# Patient Record
Sex: Male | Born: 1960 | Race: White | Hispanic: No | Marital: Single | Smoking: Current some day smoker
Health system: Southern US, Community
[De-identification: ages and names within clinical notes are randomized; demographics above are authoritative.]

## PROBLEM LIST (undated history)

## (undated) DIAGNOSIS — N529 Male erectile dysfunction, unspecified: Secondary | ICD-10-CM

## (undated) DIAGNOSIS — M51369 Other intervertebral disc degeneration, lumbar region without mention of lumbar back pain or lower extremity pain: Secondary | ICD-10-CM

## (undated) DIAGNOSIS — J3081 Allergic rhinitis due to animal (cat) (dog) hair and dander: Secondary | ICD-10-CM

## (undated) DIAGNOSIS — Z9109 Other allergy status, other than to drugs and biological substances: Secondary | ICD-10-CM

## (undated) DIAGNOSIS — E538 Deficiency of other specified B group vitamins: Secondary | ICD-10-CM

## (undated) DIAGNOSIS — Z96649 Presence of unspecified artificial hip joint: Secondary | ICD-10-CM

## (undated) DIAGNOSIS — I10 Essential (primary) hypertension: Secondary | ICD-10-CM

## (undated) DIAGNOSIS — T7840XA Allergy, unspecified, initial encounter: Secondary | ICD-10-CM

## (undated) DIAGNOSIS — M5136 Other intervertebral disc degeneration, lumbar region: Secondary | ICD-10-CM

## (undated) DIAGNOSIS — M1990 Unspecified osteoarthritis, unspecified site: Secondary | ICD-10-CM

## (undated) DIAGNOSIS — K635 Polyp of colon: Secondary | ICD-10-CM

## (undated) HISTORY — PX: REPLACEMENT TOTAL HIP W/  RESURFACING IMPLANTS: SUR1222

## (undated) HISTORY — DX: Other intervertebral disc degeneration, lumbar region without mention of lumbar back pain or lower extremity pain: M51.369

## (undated) HISTORY — DX: Essential (primary) hypertension: I10

## (undated) HISTORY — PX: JOINT REPLACEMENT: SHX530

## (undated) HISTORY — DX: Allergic rhinitis due to animal (cat) (dog) hair and dander: J30.81

## (undated) HISTORY — DX: Polyp of colon: K63.5

## (undated) HISTORY — DX: Other allergy status, other than to drugs and biological substances: Z91.09

## (undated) HISTORY — DX: Other intervertebral disc degeneration, lumbar region: M51.36

## (undated) HISTORY — DX: Deficiency of other specified B group vitamins: E53.8

## (undated) HISTORY — DX: Male erectile dysfunction, unspecified: N52.9

## (undated) HISTORY — DX: Unspecified osteoarthritis, unspecified site: M19.90

## (undated) HISTORY — DX: Allergy, unspecified, initial encounter: T78.40XA

## (undated) HISTORY — PX: SKIN LESION EXCISION: SHX2412

## (undated) HISTORY — DX: Presence of unspecified artificial hip joint: Z96.649

---

## 1998-07-06 DIAGNOSIS — I10 Essential (primary) hypertension: Secondary | ICD-10-CM

## 1998-07-06 HISTORY — DX: Essential (primary) hypertension: I10

## 2000-08-02 ENCOUNTER — Encounter: Payer: Self-pay | Admitting: Orthopedic Surgery

## 2000-08-09 ENCOUNTER — Inpatient Hospital Stay (HOSPITAL_COMMUNITY): Admission: RE | Admit: 2000-08-09 | Discharge: 2000-08-13 | Payer: Self-pay | Admitting: Orthopedic Surgery

## 2000-08-09 ENCOUNTER — Encounter: Payer: Self-pay | Admitting: Orthopedic Surgery

## 2005-10-28 ENCOUNTER — Encounter: Admission: RE | Admit: 2005-10-28 | Discharge: 2005-10-28 | Payer: Self-pay | Admitting: Otolaryngology

## 2006-12-02 ENCOUNTER — Encounter: Admission: RE | Admit: 2006-12-02 | Discharge: 2006-12-02 | Payer: Self-pay | Admitting: Orthopedic Surgery

## 2007-09-07 ENCOUNTER — Inpatient Hospital Stay (HOSPITAL_COMMUNITY): Admission: RE | Admit: 2007-09-07 | Discharge: 2007-09-10 | Payer: Self-pay | Admitting: Orthopedic Surgery

## 2010-11-18 NOTE — Op Note (Signed)
NAME:  Walter Black, Walter Black                 ACCOUNT NO.:  [id number]   MEDICAL RECORD NO.:  [id number]          PATIENT TYPE:  INP   LOCATION:  0002                         FACILITY:  [facility]   PHYSICIAN:  Ollen Gross, M.D.    DATE OF BIRTH:  [birth year]   DATE OF PROCEDURE:  DATE OF DISCHARGE:                               OPERATIVE REPORT   PREOPERATIVE DIAGNOSIS:  Osteoarthritis, right hip.   POSTOPERATIVE DIAGNOSIS:  Osteoarthritis, right hip.   PROCEDURE:  Right total hip arthroplasty.   SURGEON:  Ollen Gross, M.D.   ASSISTANT:  Avel Peace, PA-C.   ANESTHESIA:  General.   ESTIMATED BLOOD LOSS:  700 ML.   DRAINS:  Hemovac x1.   COMPLICATIONS:  None.   CONDITION:  Stable.   BRIEF CLINICAL NOTE:  Walter Black is a 50 year old male with end-stage  arthritis of the right hip with progressively worsening pain and  dysfunction.  He has had a successful left total hip arthroplasty and  presents now for right total hip arthroplasty.   PROCEDURE IN DETAIL:  After successful administration of general  anesthetic, the patient is placed in the left lateral decubitus position  with the right side up and held with the hip positioner.  The right  lower extremity is isolated from his perineum with plastic drapes and  prepped and draped in the usual sterile fashion.  A short posterolateral  incision is made with a 10 blade through subcutaneous tissue to the  level of the fascia lata, which was incised in line with the skin  incision.  The sciatic nerve is palpated and protected and the short  external rotators isolated off the femur.  Capsulectomy is performed and  the hip is dislocated.  The center of the femoral head is marked and a  trial prosthesis placed such that the center of the trial head  corresponds to the center of his native femoral head.  Osteotomy lines  marked on the femoral neck and osteotomy made with an oscillating saw.  The femoral head is removed and then the femur  retracted anteriorly to  gain acetabular exposure.   Acetabular retractors were placed and labrum and osteophytes removed.  Reaming starts at 45 mm, coursing increments of 2 up to 53 mm, then a 54  mm Pinnacle acetabular shell is placed in anatomic position and  transfixed with two dome screws.  The apex hole eliminator was placed  and a permanent 36-mm metal liner is placed.  This is a metal-on-metal  hip replacement.   The femur is prepared with a canal finder and irrigation.  Axial reaming  is performed to 17.5 mm proximal, reaming to 22 D and the sleeve  machined to a small.  A 22 D small trial sleeve is placed with a 22 x 17  stem and a 36 + 8 neck.  A 32+ 0 trial head is placed.  It reduced  fairly easily.  I switched the sleeve out to a D large because the small  was sunken down into the canal a millimeter or 2 and I was concerned  about  sizing.  With the large we were about a millimeter above the bone  cut, but it had a more stable fit to it.  With the trial 22 x 17 stem,  36+ 8 neck and the 32 + 0 about 10 degrees beyond native anteversion, we  reduced and had great stability.  There was full extension, full  external rotation, 70 degrees flexion, 40 degrees adduction, 90 degrees  internal rotation and 90 degrees of flexion, and 70 degrees of internal  rotation.  By placing the right leg on top of the left, leg lengths are  found to be equal.  The hip is then dislocated and all trials are  removed.  The permanent 22 D large sleeve is placed with 22 x 17 stem  and a 36 + 8 neck 10 degrees beyond native anteversion.  A 36 + 0 zero  head is placed and the hip is reduced, with the same stability  parameters.  Wound is copiously irrigated with saline solution and the  short rotators reattached to the femur through drill holes.  The fascia  lata was closed over a Hemovac drain with interrupted #1 Vicryl, the  subcu closed with 1-0 and 2-0 Vicryl and a subcuticular running 4-0   Monocryl.  The drain is hooked to suction.  Incision cleaned and dried,  and Steri-Strips and a bulky sterile dressing applied.  I then placed  into a knee immobilizer, awakened and transported to recovery in stable  condition.      Ollen Gross, M.D.  Electronically Signed     FA/MEDQ  D:  09/07/2007  T:  09/07/2007  Job:  [id number]

## 2010-11-21 NOTE — H&P (Signed)
NAMEJUAQUIN, Walter Black                 ACCOUNT NO.:  [id number]   MEDICAL RECORD NO.:  [id number]          PATIENT TYPE:  INP   LOCATION:  1530                         FACILITY:  [facility]   PHYSICIAN:  Ollen Gross, M.D.    DATE OF BIRTH:  Dec 07, 1960   DATE OF ADMISSION:  09/07/2007  DATE OF DISCHARGE:                              HISTORY & PHYSICAL   CHIEF COMPLAINT:  Right hip pain.   HISTORY OF PRESENT ILLNESS:  The patient is a 50 year old male who has  seen by Dr. Lequita Halt for ongoing hip pain.  He has known arthritis.  Unfortunately, his hip has steadily progressively gotten worse.  He is  having worsening pain and dysfunction.  He is seen in the office where  he is found to have unconfirmed end-stage arthritis in the right hip.  It has actually worsened on radiograph since May of this past year.  He  has reached a point where he would benefit undergoing surgical  intervention.  Risks and benefits have been discussed and he elects to  proceed with surgery.   ALLERGIES:  NO KNOWN DRUG ALLERGIES.   CURRENT MEDICATIONS:  Celebrex, Ultram, Claritin.  He is on blood  pressure medication, Flexeril, Nasonex.   PAST MEDICAL HISTORY:  1. Hypertension.  2. Seasonal allergies.   PAST SURGICAL HISTORY:  Left total hip replacement.   FAMILY HISTORY:  Arthritis.   SOCIAL HISTORY:  Single, is a Psychologist, sport and exercise, two cigars a week, two  drinks of alcohol.   REVIEW OF SYSTEMS:  GENERAL:  No fevers, chills, night sweats.  NEUROLOGICAL:  No seizures, syncope or paralysis.  RESPIRATORY: No  shortness breath, productive cough or hemoptysis.  CARDIOVASCULAR: No  chest pain, angina, orthopnea.  GI: No nausea, diarrhea, constipation.  GU: No dysuria or discharge.  MUSCULOSKELETAL:  Right hip.   PHYSICAL EXAMINATION:  VITAL SIGNS: Pulse 88, respirations 14, blood  pressure 126/84.  GENERAL:  A 50 year old white male, well-nourished, well-developed, no  acute distress, slightly overweight, alert  and cooperative.  Good  historian.  HEENT: Normocephalic, atraumatic.  Pupils are reactive.  EOMs intact.  No wear glasses.  NECK:  Supple.  No bruits.  CHEST:  Clear anterior posterior chest walls.  No rhonchi, rales or  wheezing.  HEART:  Regular rate and rhythm.  No murmur, S1-S10.  ABDOMEN: Soft, round.  Bowel sounds present.  BREAST/RECTAL/GENITALIA:  Not done, not pertinent to present illness.  EXTREMITIES:  Right hip flexion 90-0 internal rotation, 10-15 degrees  external rotation, 10-15 degrees abduction.  Left hip flexion 110,  internal rotation 30, external rotation 40, abduction of 40.   IMPRESSION:  Osteoarthritis right hip.   PLAN:  The patient admitted to [facility] to undergo a right  total hip replacement arthroplasty.  Surgery will be performed by Dr.  Ollen Gross.      Walter Black, P.A.C.      Ollen Gross, M.D.  Electronically Signed    ALP/MEDQ  D:  09/08/2007  T:  09/09/2007  Job:  [id number]   cc:   Ollen Gross, M.D.  Fax: [phone]   Lovenia Kim, D.O.  Fax: [phone]

## 2010-11-21 NOTE — Discharge Summary (Signed)
[name], [name]                 ACCOUNT NO.:  [id number]   MEDICAL RECORD NO.:  [id number]          PATIENT TYPE:  INP   LOCATION:  1530                         FACILITY:  [facility]   PHYSICIAN:  Walter Black, M.D.    DATE OF BIRTH:  Aug 08, 1960   DATE OF ADMISSION:  09/07/2007  DATE OF DISCHARGE:  09/10/2007                               DISCHARGE SUMMARY   ADMITTING DIAGNOSES:  1. Osteoarthritis right hip.  2. Hypertension.  3. Seasonal allergies.   DISCHARGE DIAGNOSES:  1. Osteoarthritis right hip, status post right total hip arthroplasty.  2. Postoperative blood loss anemia, did not require transfusion.  3. Hypertension.  4. Seasonal allergies.   PROCEDURE:  September 07, 2007, right total hip.   SURGEON:  Walter Black, M.D.   ASSISTANT:  Walter Girt PA-C.   ANESTHESIA:  General.   CONSULTS:  None.   BRIEF HISTORY:  Walter Black is a 50 year old male with end-stage arthritis,  progressive worsening pain and dysfunction, successful left total hip  who now presents for right total hip.   LABORATORY DATA:  Pre-op CBC:  Hemoglobin 15.6, hematocrit 44.6, white  cell count 7.6, normal platelets 276, post-op hemoglobin 12.1, drift  down to 10.5.  Last noted H&H 10.4 and 29.4.  PT/INR 12.7 and 0.9 with a  PTT of 30.  Serial pro-times followed.  Last noted PT/INR 18.8 and 1.5.  Chem panel on admission all within normal limits.  Serial BMETs were  followed.  Electrolytes remained within normal limits.  Glucose did go  up from 109 to 153, back down to 121.  Pre-op UA negative, blood group  type O+.  Pre-op chest x-ray September 01, 2007:  Note acute  cardiomegaly, no cardiopulmonary disease.  Chest X-Ray:  September 01, 2007:  No active cardiopulmonary disease.  Right hip film September 01, 2007:  Severe osteoarthritis, right hip with slight degree of __________  of the head, status post left total hip.  Portable hip and pelvis September 07, 2007:  Right total hip without immediate complicating  features.   EKG:  April 14, 2007:  Within normal limits.   HOSPITAL COURSE:  The patient admitted to [facility],  tolerated the procedure well, later transferred to recovery room,  orthopedic floor, started on PCA and p.o. analgesic pain control  following surgery and doing pretty well on the morning of day.  Had  dangled his legs  the evening of surgery.  Started getting up with  therapy on the morning of day one.  Blood pressure looked good,  hemoglobin stable, excellent output, walking about 400 feet on day two,  continued to progress well.  Hemoglobin stable at 10.5.  Dressing change  incision looked good.  Felt to be doing well.  Was ready to go home by  the following day of September 10, 2007.   DISCHARGE/PLAN:  1. Patient discharged home on September 10, 2007.  2. Discharge diagnoses:  Please see above.  3. Discharge meds:  Lovenox, Percocet, Robaxin, Coumadin.   DIET:  Low-sodium, heart-healthy diet.   ACTIVITY:  Partial weightbearing 25%  to 50%, total hip protocol, home  health PT, home health nursing.   FOLLOWUP:  Two weeks.   DISPOSITION:  Home.   CONDITION ON DISCHARGE:  Improving.      Walter Black, P.A.C.      Walter Black, M.D.  Electronically Signed    ALP/MEDQ  D:  10/10/2007  T:  10/10/2007  Job:  [id number]   cc:   Walter Black, D.O.  Fax: [phone]

## 2010-11-21 NOTE — H&P (Signed)
[facility]  Patient:    Walter Black, Walter Black                       MRN: [id number] Adm. Date:  08/09/00 Attending:  Ollen Gross, M.D. Dictator:   Druscilla Brownie. Shela Nevin, P.A. CC:         Ammie Dalton, M.D.   History and Physical  DATE OF BIRTH:  [birth year].  CHIEF COMPLAINT:  "Pain in my left hip."  PRESENT ILLNESS:  This is a 50 year old who has been seen by [place] for continuing problems concerning his left hip.  Over the last year or so, he has had problems with the hip and decreasing levels of activity due to pain in the hip.  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  His pain primarily is in the lateral groin and with some range of motion.  The patient has developed limitation of range of motion and has a 5 degree external rotation contracture without any evidence of internal rotation; he also has developed a flexion contracture.  He walks with an antalgic gait with an abductor lurch.  X-rays have shown a deformity of the femoral head with bone-on-bone changes and severe arthritis.  Due to the fact that this is in fact a very young individual who has had decreasing levels of activity and persistent and constant pain, it is felt he would benefit from surgical intervention and is being admitted for total hip replacement arthroplasty to the left hip utilizing an S-ROM hip with ______ cup and metal-on-metal bearing surfaces.  The patient has donated no blood for this procedure.  PAST MEDICAL HISTORY:  This gentleman has really been in good health throughout his lifetime.  He has some hypertension and is being treated by Dr. Ammie Dalton.  CURRENT MEDICATIONS 1. Hydrochlorothiazide 25 mg two q.d. 2. Claritin-D 24 Hours 10/240 mg one daily. 3. Nasonex 50 mcg one daily. 4. Vioxx 25 mg one daily. 5. Ultram one to two q.4-6h. p.r.n. pain. 6. He  also has allergy shots. 7. He is begun today on K-Dur 10 mEq one q.d., as he has hypokalemia on his    surgical labs.  ALLERGIES:  No known drug allergies.  FAMILY PHYSICIAN:  Dr. Ammie Dalton.  SOCIAL HISTORY:  The patient smokes about a quarter of a pack of cigarettes per day and has three or four beers per day.  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  FAMILY HISTORY:  Noncontributory.  REVIEW OF SYSTEMS:  CNS:  No seizure disorder, paralysis, numbness or double vision.  RESPIRATORY:  No productive cough.  No hemoptysis.  No shortness of breath.  CARDIOVASCULAR:  No chest pain.  No angina.  No orthopnea. GASTROINTESTINAL:  No nausea, vomiting, melena or bloody stools. GENITOURINARY:  No discharge, dysuria or hematuria.  MUSCULOSKELETAL: Primarily as in present illness with his left hip.  PHYSICAL EXAMINATION  GENERAL:  Alert, cooperative and friendly 50 year old white male whose vital signs are:  Blood pressure 138/88, pulse 80, regular, respirations 12.  HEENT:  Normocephalic.  PERRLA.  Oropharynx is clear.  CHEST:  Clear to auscultation.  No rhonchi nor rales.  HEART:  Regular rate and rhythm.  No murmurs are heard.  ABDOMEN:  Soft and nontender.  Liver and spleen not felt.  GENITALIA:  Not done, not pertinent to present illness.  RECTAL:  Not done, not pertinent to present illness.  EXTREMITIES:  Left hip as in present illness above.  ADMITTING DIAGNOSES 1. Severe arthritis, left hip. 2. Hypertension. 3. Hypokalemia by preoperative laboratories.  PLAN:  The patient will undergo total hip replacement arthroplasty of the left hip.  He is begun today on a short run of p.o. potassium and we will repeat his BMET preoperatively.  After his regular hospitalization, he will plan to go home with physical therapy there.  He says he has friends to tend to him at home during his postop period. DD:  08/03/00 TD:  08/03/00 Job:  [id number]

## 2010-11-21 NOTE — Discharge Summary (Signed)
[facility]  Patient:    Walter Black, Walter Black                        MRN: [id number] Adm. Date:  [id number] Disc. Date: [id number] Attending:  Loanne Drilling Dictator:   Della Goo, P.A.                           Discharge Summary  ADMITTING DIAGNOSES:  1. Severe arthritis, left hip.  2. Hypertension.  3. Preoperative hypokalemia.  DISCHARGE DIAGNOSES:  1. Severe arthritis, left hip.  2. Hypertension.  3. Preoperative hypokalemia.  4. Post-hemorrhagic anemia not requiring blood transfusion.  5. Hypokalemia resolving.  6. Hyponatremia resolved at discharge.  PROCEDURE:  On August 09, 2000, the patient underwent left total hip arthroplasty performed by Dr. Lequita Halt assisted by Shelbie Proctor, P.A.-C. under spinal anesthesia.  CONSULTATIONS:  None.  BRIEF HISTORY:  Mr. Gatt is a 50 year old male with severe osteoarthritis of the left hip greater than the right hip. He has had progressive pain and dysfunction of the hip refractory to non-operative management. It was felt he would require surgical intervention and was admitted for the procedure as stated above.  BRIEF HOSPITAL COURSE:  The patient tolerated the procedure without difficulty. Postoperatively neuromuscular motor function was noted to be intact in the lower extremities after the spinal anesthetic had resolved. The patient was placed on Coumadin for DVT and PE prophylaxis. This was started by the pharmacy postoperatively with adjustments in Coumadin made according to daily protimes. The patient did have an episode of hypotension postoperatively felt to be related to narcotic use and the patient was given Narcan which normalized his blood pressure. The patient had hypokalemia treated with IV K-Dur and then eventually with p.o. potassium supplementation. At the time of discharge, potassium was noted to be stable at 3.4. The Hemovac drain was discontinued on the first postoperative day and  dressing changes were done daily thereafter with wound healing well. The patient was started on physical therapy for ambulation and gait training as well as range of motion and strengthening exercises and total hip replacement precautions. He tolerated physical therapy well progressing with ambulation as much as 300 feet while in the hospital. He was allowed only touchdown weightbearing on the operative extremity and was able to maintain this without difficulty. The patient developed hyponatremia and when his IV fluids were discontinued his sodium level normalized. The patient had a few low grade temperatures during the hospital stay and was encouraged to use his incentive spirometry to treat atelectasis. His Foley catheter was discontinued and he was able to void without difficulty without signs or symptoms of urinary tract infection. Hemoglobin dropped to the lowest value postoperatively to 10.8 and he did not require blood transfusion during the hospital stay. On August 13, 2000, which was the patients fourth postoperative day, he was felt stable for discharge to his home with continued care from a home health agency.  PERTINENT LABORATORY DATA:  Admission CBC with values within normal limits. Hemoglobin 16.3, hematocrit 45.3 on admission. Hemoglobin lowest value dropped to 10.8 with hematocrit 30.8 postoperatively. Coagulation studies on admission were normal and elevations in PT and INR were noted while on Coumadin. Chemistry studies on admission revealed hypokalemia at 3.2. Postoperatively the patient was treated with potassium supplementation and his potassium level ranged from 3.0 to 3.4 with 3.4 being the value at discharge. Urinalysis on admission was  negative. Urinary tract infection and repeat urinalysis with the removal of his Foley catheter once again was negative. EKG on admission showed normal sinus rhythm with no old tracings for comparison. Chest x-ray on admission was  negative for active disease.  PLAN:  The patient is discharged to home with arrangements for [facility] to provide him home health physical therapy as well as Coumadin management. He will receive physical therapy three times a week for ambulation and gait training and he will continue to be touchdown weightbearing only. He will adhere to strict total hip replacement precautions and do strengthening exercises. Protimes will be drawn weekly and adjustments made in his Coumadin dose according to the pharmacist for [facility]. The patient will follow-up with Dr. Lequita Halt two weeks from the date of his surgery and will call to make the appointment.  DISCHARGE MEDICATIONS:  1. Coumadin 5 mg two daily.  2. Trinsicon one b.i.d.  3. Robaxin one every 6h as needed for spasm.  4. Demerol 50 mg one to two ever 4-6h as needed for pain.  5. Phenergan 25 mg 1/2 tablet every 4h as needed for nausea.  Durable medical equipment was made available for the patient for home use. He has no diet restrictions. If he has questions or concerns prior to his return office visit, he has been advised to call the office. Dressing change will be done daily at home and he was given supplies to do so. DD:  09/22/00 TD:  09/23/00 Job: [id number]

## 2010-11-21 NOTE — Op Note (Signed)
[facility]  Patient:    Walter Black, Walter Black                        MRN: [id number] Proc. Date: 08/09/00 Adm. Date:  [id number] Attending:  Loanne Drilling Dictator:   Ollen Gross, M.D.                           Operative Report  PREOPERATIVE DIAGNOSIS:  Osteoarthritis, left hip.  POSTOPERATIVE DIAGNOSIS:  Osteoarthritis, left hip.  OPERATION:  Left total hip arthroplasty.  SURGEON:  Ollen Gross, M.D.  ASSISTANT:  Ralene Bathe, P.A.  ANESTHESIA:  Spinal.  ESTIMATED BLOOD LOSS:  400 cc.  DRAIN:  Hemovac x 1.  COMPLICATIONS:  None.  CONDITION:  Stable to recovery room.  BRIEF CLINICAL NOTE:  Walter Black is a 50 year old male with severe osteoarthritis of the left greater than right hip.  He has had pain refractory to nonoperative management.  He has had significant limitations in function. He presents for left total hip arthroplasty.  DESCRIPTION OF PROCEDURE:  After successful administration of general anesthetic, the patient was placed in the right lateral decubitus position with left side upheld in hip positioner.  The left lower extremity was isolated from the perineum with plastic drapes and prepped and draped in the usual sterile fashion.  Standard posterolateral incision was then made.  Skin cut with #10 blade, subcutaneous tissue to level of fascia lata which was incised in line with skin incision.  Short external rotators were isolated off the femur and capsulectomy performed.  The hip is dislocated and center of femoral head marked for preop templating. Trial prosthesis is placed that shows the center of the trial head corresponds to center of his native head, and then the osteotomy is made with an oscillating saw.  The femur is then retracted anteriorly and the anterior capsule removed.  Acetabular exposure is obtained.  Acetabular reaming is then started with a size 47 coursing in increments of 2 up to 55, then a 56 mm Pinnacle cup  is impacted into the acetabulum.  Matching is made with anteversion of about 40 degrees of abduction and 20 degrees of forward flexion. It is then transfixed with two dome screws.  Trial 36 mm, neutral liner is placed.  Femoral preparation is initiated with the canal finder and then with starter reamers.  The canal is irrigated, and then axial reaming is performed up to 17.5 mm.  The proximal sleeve is then reamed up to a 22D, and the sleeve is machined up to a small.  A trial 22D small sleeve is placed, and then a 22 x 17 stem with a 36 +8 neck, and then a 36 +0 mm head is placed.  Hip is reduced, and has outstanding stability.  Full external rotation, full extension, and 70 degrees flexion, 40 degrees adduction, 90 degrees internal rotation and 90 degrees flexion.  Soft tissue tension was appropriate.  The trials are then removed, and the apex hole eliminator is placed into the acetabular shell.  The 36 mm neutral metal liner is then positioned into the acetabular shell and gently impacted.  The 22D small sleeve is then impacted into the proximal femur, and the 22 x 17 stem with 36 +8 neck is impacted into the femoral canal.  The permanent 36 +0 head is placed, hip reduced with the same stability parameters.  Wound is copiously irrigated with antibiotic solution.  The short external rotator is reattached to the femur through drill holes.  Fascia lata and fascia of gluteus maximum then closed over one limb of Hemovac drain with interrupted #1 Vicryl.  Subcutaneous tissue closed in two layers with interrupted #1 and then interrupted 2-0 Vicryl.  Subcuticular is closed with running 4-0 Monocryl.  Drain is left to suction.  Incision clean and dry, and a Steri-Strips and bulky sterile dressing applied.  The patient is awakened and transported to recovery in stable condition. DD:  08/09/00 TD:  08/10/00 Job: [id number]

## 2011-03-27 LAB — CBC
HCT: 44.6
MCHC: 34.9
MCV: 93.2
Platelets: 276
RDW: 12
WBC: 7.6

## 2011-03-27 LAB — URINALYSIS, ROUTINE W REFLEX MICROSCOPIC
Bilirubin Urine: NEGATIVE
Nitrite: NEGATIVE
Specific Gravity, Urine: 1.02
Urobilinogen, UA: 1
pH: 7

## 2011-03-27 LAB — COMPREHENSIVE METABOLIC PANEL
AST: 24
Albumin: 4.1
BUN: 14
Calcium: 9.4
Chloride: 104
Creatinine, Ser: 0.85
GFR calc Af Amer: >60
Total Protein: 7

## 2011-03-27 LAB — PROTIME-INR: INR: 0.9

## 2011-03-27 LAB — APTT: aPTT: 30

## 2011-03-30 LAB — BASIC METABOLIC PANEL
BUN: 6
BUN: 6
CO2: 29
CO2: 30
Calcium: 8.1 — ABNORMAL LOW
Calcium: 8.5
Chloride: 102
Creatinine, Ser: 0.88
Creatinine, Ser: 0.88
GFR calc Af Amer: >60
GFR calc non Af Amer: >60
Glucose, Bld: 153 — ABNORMAL HIGH
Potassium: 3.6
Sodium: 135

## 2011-03-30 LAB — CBC
HCT: 34.6 — ABNORMAL LOW
Hemoglobin: 12.1 — ABNORMAL LOW
MCHC: 35
MCHC: 35.5
MCHC: 35.5
MCV: 92.8
Platelets: 221
Platelets: 261
Platelets: 271
RDW: 11.8
RDW: 12
RDW: 12

## 2011-03-30 LAB — PROTIME-INR
INR: 1
INR: 1.2
INR: 1.5
Prothrombin Time: 15.6 — ABNORMAL HIGH
Prothrombin Time: 18.8 — ABNORMAL HIGH

## 2011-03-30 LAB — TYPE AND SCREEN: Antibody Screen: NEGATIVE

## 2011-08-03 ENCOUNTER — Encounter: Payer: Self-pay | Admitting: Gastroenterology

## 2011-08-10 ENCOUNTER — Ambulatory Visit (AMBULATORY_SURGERY_CENTER): Payer: 59

## 2011-08-10 VITALS — Ht 73.0 in | Wt 236.0 lb

## 2011-08-10 DIAGNOSIS — Z1211 Encounter for screening for malignant neoplasm of colon: Secondary | ICD-10-CM

## 2011-08-10 MED ORDER — PEG-KCL-NACL-NASULF-NA ASC-C 100 G PO SOLR: 1.0000 | Freq: Once | ORAL | Status: DC

## 2011-08-11 ENCOUNTER — Encounter: Payer: Self-pay | Admitting: Gastroenterology

## 2011-08-24 ENCOUNTER — Encounter: Payer: Self-pay | Admitting: Gastroenterology

## 2011-08-24 ENCOUNTER — Ambulatory Visit (AMBULATORY_SURGERY_CENTER): Payer: 59 | Admitting: Gastroenterology

## 2011-08-24 VITALS — BP 137/75 | HR 55 | Temp 96.1°F | Resp 20 | Ht 73.0 in | Wt 236.0 lb

## 2011-08-24 DIAGNOSIS — Z1211 Encounter for screening for malignant neoplasm of colon: Secondary | ICD-10-CM

## 2011-08-24 DIAGNOSIS — D126 Benign neoplasm of colon, unspecified: Secondary | ICD-10-CM

## 2011-08-24 MED ORDER — SODIUM CHLORIDE 0.9 % IV SOLN: 500.0000 mL | INTRAVENOUS | Status: DC

## 2011-08-24 NOTE — Patient Instructions (Addendum)
YOU HAD AN ENDOSCOPIC PROCEDURE TODAY AT THE [facility]: Refer to the procedure report that was given to you for any specific questions about what was found during the examination.  If the procedure report does not answer your questions, please call your gastroenterologist to clarify.  If you requested that your care partner not be given the details of your procedure findings, then the procedure report has been included in a sealed envelope for you to review at your convenience later.  YOU SHOULD EXPECT: Some feelings of bloating in the abdomen. Passage of more gas than usual.  Walking can help get rid of the air that was put into your GI tract during the procedure and reduce the bloating. If you had a lower endoscopy (such as a colonoscopy or flexible sigmoidoscopy) you may notice spotting of blood in your stool or on the toilet paper. If you underwent a bowel prep for your procedure, then you may not have a normal bowel movement for a few days.  DIET: Your first meal following the procedure should be a light meal and then it is ok to progress to your normal diet.  A half-sandwich or bowl of soup is an example of a good first meal.  Heavy or fried foods are harder to digest and may make you feel nauseous or bloated.  Likewise meals heavy in dairy and vegetables can cause extra gas to form and this can also increase the bloating.  Drink plenty of fluids but you should avoid alcoholic beverages for 24 hours.  ACTIVITY: Your care partner should take you home directly after the procedure.  You should plan to take it easy, moving slowly for the rest of the day.  You can resume normal activity the day after the procedure however you should NOT DRIVE or use heavy machinery for 24 hours (because of the sedation medicines used during the test).    SYMPTOMS TO REPORT IMMEDIATELY: A gastroenterologist can be reached at any hour.  During normal business hours, 8:30 AM to 5:00 PM Monday through Friday,  call [phone].  After hours and on weekends, please call the GI answering service at [phone] who will take a message and have the physician on call contact you.   Following lower endoscopy (colonoscopy or flexible sigmoidoscopy):  Excessive amounts of blood in the stool  Significant tenderness or worsening of abdominal pains  Swelling of the abdomen that is new, acute  Fever of 100F or higher    FOLLOW UP: If any biopsies were taken you will be contacted by phone or by letter within the next 1-3 weeks.  Call your gastroenterologist if you have not heard about the biopsies in 3 weeks.  Our staff will call the home number listed on your records the next business day following your procedure to check on you and address any questions or concerns that you may have at that time regarding the information given to you following your procedure. This is a courtesy call and so if there is no answer at the home number and we have not heard from you through the emergency physician on call, we will assume that you have returned to your regular daily activities without incident.  SIGNATURES/CONFIDENTIALITY: You and/or your care partner have signed paperwork which will be entered into your electronic medical record.  These signatures attest to the fact that that the information above on your After Visit Summary has been reviewed and is understood.  Full responsibility of the confidentiality   of this discharge information lies with you and/or your care-partner.     

## 2011-08-24 NOTE — Op Note (Signed)
[facility] [address] [place], [place]  [address]  COLONOSCOPY PROCEDURE REPORT  PATIENT:  Black, Walter  MR#:  [id number] BIRTHDATE:  Feb 09, 1961, 50 yrs. old  GENDER:  male ENDOSCOPIST:  Rachael Fee, MD REFERRING:  Loree Fee, MD PROCEDURE DATE:  08/24/2011 PROCEDURE:  Colonoscopy with snare polypectomy ASA CLASS:  Class II INDICATIONS:  Routine Risk Screening MEDICATIONS:   Fentanyl 100 mcg IV, These medications were titrated to patient response per physician's verbal order, Versed 10 mg IV  DESCRIPTION OF PROCEDURE:   After the risks benefits and alternatives of the procedure were thoroughly explained, informed consent was obtained.  Digital rectal exam was performed and revealed no rectal masses.   The [id number] endoscope was introduced through the anus and advanced to the cecum, which was identified by both the appendix and ileocecal valve, without limitations.  The quality of the prep was good..  The instrument was then slowly withdrawn as the colon was fully examined. <<PROCEDUREIMAGES>> FINDINGS:  A sessile polyp was found in the descending colon. This was 1.40mm across, removed in piecemeal fashion with snare/cautery and sent to pathology (jar 1) (see image3, image4, and image7). This was otherwise a normal examination of the colon (see image8, image1, and image2).   Retroflexed views in the rectum revealed no abnormalities. COMPLICATIONS:  None  ENDOSCOPIC IMPRESSION: 1) Sessile polyp in the descending colon, removed in piecemeal fashion and sent to pathology 2) Otherwise normal examination  RECOMMENDATIONS: 1) If the polyp(s) removed today are proven to be adenomatous (pre-cancerous) polyps, you will need a repeat colonoscopy in 6-12 months. Otherwise you should continue to follow colorectal cancer screening guidelines for "routine risk" patients with colonoscopy in 10 years. 2) You will receive a letter within 1-2 weeks with the  results of your biopsy as well as final recommendations. Please call my office if you have not received a letter after 3 weeks.  ______________________________ Rachael Fee, MD  n. eSIGNED:   Rachael Fee at 08/24/2011 11:17 AM  Dorcas Mcmurray, [id number]

## 2011-08-24 NOTE — Progress Notes (Signed)
Patient did not experience any of the following events: a burn prior to discharge; a fall within the facility; wrong site/side/patient/procedure/implant event; or a hospital transfer or hospital admission upon discharge from the facility. (G8907) Patient did not have preoperative order for IV antibiotic SSI prophylaxis. (G8918)  

## 2011-08-24 NOTE — Progress Notes (Signed)
The pt had cramping while the scope was advanced to the cecum.  Once the scope was being withdrawn, the pt relaxed and rested with his eyes closed comfortably. Maw

## 2011-08-25 ENCOUNTER — Telehealth: Payer: Self-pay | Admitting: *Deleted

## 2011-08-25 NOTE — Telephone Encounter (Signed)
No answer, message left

## 2011-08-28 ENCOUNTER — Encounter: Payer: Self-pay | Admitting: Gastroenterology

## 2011-08-31 ENCOUNTER — Encounter: Payer: Self-pay | Admitting: Gastroenterology

## 2011-10-27 ENCOUNTER — Ambulatory Visit (HOSPITAL_COMMUNITY)
Admission: RE | Admit: 2011-10-27 | Discharge: 2011-10-27 | Disposition: A | Discharge status: Home / Self Care | Payer: 59 | Source: Ambulatory Visit | Attending: Internal Medicine | Admitting: Internal Medicine

## 2011-10-27 ENCOUNTER — Other Ambulatory Visit (HOSPITAL_COMMUNITY): Payer: Self-pay | Admitting: Internal Medicine

## 2011-10-27 DIAGNOSIS — R911 Solitary pulmonary nodule: Secondary | ICD-10-CM | POA: Insufficient documentation

## 2011-10-27 DIAGNOSIS — R937 Abnormal findings on diagnostic imaging of other parts of musculoskeletal system: Secondary | ICD-10-CM

## 2011-10-27 DIAGNOSIS — Z87891 Personal history of nicotine dependence: Secondary | ICD-10-CM | POA: Insufficient documentation

## 2011-10-27 DIAGNOSIS — I10 Essential (primary) hypertension: Secondary | ICD-10-CM | POA: Insufficient documentation

## 2012-07-29 ENCOUNTER — Other Ambulatory Visit: Payer: Self-pay | Admitting: Internal Medicine

## 2012-07-29 DIAGNOSIS — R911 Solitary pulmonary nodule: Secondary | ICD-10-CM

## 2012-10-20 ENCOUNTER — Encounter: Payer: Self-pay | Admitting: Gastroenterology

## 2012-10-28 ENCOUNTER — Other Ambulatory Visit: Payer: 59

## 2012-11-02 ENCOUNTER — Ambulatory Visit
Admission: RE | Admit: 2012-11-02 | Discharge: 2012-11-02 | Disposition: A | Discharge status: Home / Self Care | Payer: 59 | Source: Ambulatory Visit | Attending: Internal Medicine | Admitting: Internal Medicine

## 2012-11-02 DIAGNOSIS — R911 Solitary pulmonary nodule: Secondary | ICD-10-CM

## 2012-11-02 MED ORDER — IOHEXOL 300 MG/ML  SOLN
75.0000 mL | Freq: Once | INTRAMUSCULAR | Status: AC | PRN
  Administered 2012-11-02: 75 mL via INTRAVENOUS

## 2012-11-15 ENCOUNTER — Telehealth: Payer: Self-pay

## 2012-11-15 ENCOUNTER — Encounter: Payer: Self-pay | Admitting: Gastroenterology

## 2012-11-15 ENCOUNTER — Ambulatory Visit (INDEPENDENT_AMBULATORY_CARE_PROVIDER_SITE_OTHER): Payer: 59 | Admitting: Gastroenterology

## 2012-11-15 VITALS — BP 118/84 | HR 64 | Ht 72.05 in | Wt 225.0 lb

## 2012-11-15 DIAGNOSIS — K649 Unspecified hemorrhoids: Secondary | ICD-10-CM

## 2012-11-15 MED ORDER — PRAMOXINE-HC 1-1 % EX CREA: TOPICAL_CREAM | CUTANEOUS | Status: AC

## 2012-11-15 NOTE — Telephone Encounter (Signed)
5/19 with Dr Karie Soda arrive at 2.45 for a 3.15  Pt has been notified

## 2012-11-15 NOTE — Progress Notes (Signed)
Review of pertinent gastrointestinal problems: 1. Routine risk for Colon cancer:  Small hyperplastic polyp removed on colonoscopy February 2013. Recall colonoscopy at 10 year interval   HPI: This is a    very pleasant 52 year old man whom I last saw about a year ago at the time of a routine colonoscopy for colon cancer screening.  He is here today for a different issue   Has flares of hemorrhoids every 1-2 weeks.   These flares are itching, burning.  Annoying, can make him pretty miserable.  Tries OTC prep H.  Also steroid cream prescription.  Has 2 bms daily.  Fairly soft BMs usually, usually keeps its shape.    Took some pepto a month ago.  Review of systems: Pertinent positive and negative review of systems were noted in the above HPI section. Complete review of systems was performed and was otherwise normal.    Past Medical History  Diagnosis Date  . Hypertension 2000  . Arthritis     hips now resolved s/p surgery; still has it in lower back  . Cat allergies   . Environmental allergies     Past Surgical History  Procedure Laterality Date  . 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 celecoxib (CELEBREX) 200 MG capsule Take 200 mg by mouth daily.      Marland Kitchen loratadine-pseudoephedrine (CLARITIN-D 24-HOUR) 10-240 MG per 24 hr tablet Take 1 tablet by mouth as needed.      . mometasone (NASONEX) 50 MCG/ACT nasal spray Place 2 sprays into the nose daily.      . nebivolol (BYSTOLIC) 10 MG tablet Take 10 mg by mouth daily.      . traMADol (ULTRAM) 50 MG tablet Take 50 mg by mouth every 6 (six) hours as needed.       No current facility-administered medications for this visit.    Allergies as of 11/15/2012  . (No Known Allergies)    Family History  Problem Relation Age of Onset  . Colon cancer Neg Hx   . Cancer Mother     "lymph node"    History   Social  History  . Marital Status: Single    Spouse Name: N/A    Number of Children: N/A  . Years of Education: N/A   Occupational History  . Not on file.   Social History Main Topics  . Smoking status: Current Some Day Smoker    Types: Cigars  . Smokeless tobacco: Never Used  . Alcohol Use: Yes     Comment: daily  . Drug Use: Not on file  . Sexually Active: Not on file   Other Topics Concern  . Not on file   Social History Narrative  . No narrative on file       Physical Exam: BP 118/84  Pulse 64  Ht 6' 0.05" (1.83 m)  Wt 225 lb (102.059 kg)  BMI 30.48 kg/m2 Constitutional: generally well-appearing Psychiatric: alert and oriented x3 Eyes: extraocular movements intact Mouth: oral pharynx moist, no lesions Neck: supple no lymphadenopathy Cardiovascular: heart regular rate and rhythm Lungs: clear to auscultation bilaterally Abdomen: soft, nontender, nondistended, no obvious ascites, no peritoneal signs, normal bowel sounds Extremities: no lower extremity edema bilaterally Skin: no lesions on visible extremities  rectal examination: No clear anal fissure, there was some decompressed, slightly baggy hemorrhoidal-like tissue externally, there is no pain, no  thrombosed  hemorrhoids internally or externally, no rectal masses distally. Stool is Devora and Hemoccult negative.   Assessment and plan: 52 y.o. male with  intermittent hemorrhoidal discomforts  He has really 0 constipation to try to treat, this would usually help alleviate hemorrhoidal flares. I am still going to try to bulk his stools with fiber and I've given him a prescription for Analpram which she will apply twice daily as needed. I set him up to meet a general surgeon as well. I have a feeling that conservative measures are not going to help since he really has no constipation he is bothered by.

## 2012-11-15 NOTE — Patient Instructions (Addendum)
Please start taking citrucel (orange flavored) powder fiber supplement.  This may cause some bloating at first but that usually goes away. Begin with a small spoonful and work your way up to a large, heaping spoonful daily over a week. New prescription for topical ointment, use this PRN. General surgery referral for chronic hemorrhoids.                                               We are excited to introduce MyChart, a new best-in-class service that provides you online access to important information in your electronic medical record. We want to make it easier for you to view your health information - all in one secure location - when and where you need it. We expect MyChart will enhance the quality of care and service we provide.  When you register for MyChart, you can:    View your test results.    Request appointments and receive appointment reminders via email.    Request medication renewals.    View your medical history, allergies, medications and immunizations.    Communicate with your physician's office through a password-protected site.    Conveniently print information such as your medication lists.  To find out if MyChart is right for you, please talk to a member of our clinical staff today. We will gladly answer your questions about this free health and wellness tool.  If you are age 52 or older and want a member of your family to have access to your record, you must provide written consent by completing a proxy form available at our office. Please speak to our clinical staff about guidelines regarding accounts for patients younger than age 87.  As you activate your MyChart account and need any technical assistance, please call the MyChart technical support line at [phone]) or email your question to [email or web address]. If you email your question(s), please include your name, a return phone number and the best time to reach you.  If you have  non-urgent health-related questions, you can send a message to our office through MyChart at [email or web address]. If you have a medical emergency, call 911.  Thank you for using MyChart as your new health and wellness resource!   MyChart licensed from [employer],  [id number]. Patents Pending.

## 2012-11-21 ENCOUNTER — Encounter (INDEPENDENT_AMBULATORY_CARE_PROVIDER_SITE_OTHER): Payer: Self-pay | Admitting: Surgery

## 2012-11-21 ENCOUNTER — Ambulatory Visit (INDEPENDENT_AMBULATORY_CARE_PROVIDER_SITE_OTHER): Payer: 59 | Admitting: Surgery

## 2012-11-21 VITALS — BP 104/82 | HR 62 | Resp 18 | Ht 73.0 in | Wt 226.0 lb

## 2012-11-21 DIAGNOSIS — K589 Irritable bowel syndrome without diarrhea: Secondary | ICD-10-CM

## 2012-11-21 NOTE — Patient Instructions (Addendum)
You have any somewhat enlarged right posterior hemorrhoid.  Staying on Citrucel and avoiding diarrhea should help it calm down.  If worsens, call us to consider banding of that hemorrhoid or other intervention  HEMORRHOIDS  The rectum is the last foot of your colon, and it naturally stretches to hold stool.  Hemorrhoidal piles are natural clusters of blood vessels that help the rectum and anal canal stretch to hold stool and allow bowel movements to eliminate feces.   Hemorrhoids are abnormally swollen blood vessels in the rectum.  Too much pressure in the rectum causes hemorrhoids by forcing blood to stretch and bulge the walls of the veins, sometimes even rupturing them.  Hemorrhoids can become like varicose veins you might see on a person's legs.  Most people will develop a flare of hemorrhoids in their lifetime.  When bulging hemorrhoidal veins are irritated, they can swell, burn, itch, cause pain, and bleed.  Most flares will calm down gradually own within a few weeks.  However, once hemorrhoids are created, they are difficult to get rid of completely and tend to flare more easily than the first flare.   Fortunately, good habits and simple medical treatment usually control hemorrhoids well, and surgery is needed only in severe cases. Types of Hemorrhoids:  Internal hemorrhoids usually don't initially hurt or itch; they are deep inside the rectum and usually have no sensation. If they begin to push out (prolapse), pain and burning can occur.  However, internal hemorrhoids can bleed.  Anal bleeding should not be ignored since bleeding could come from a dangerous source like colorectal cancer, so persistent rectal bleeding should be investigated by a doctor, sometimes with a colonoscopy.  External hemorrhoids cause most of the symptoms - pain, burning, and itching. Nonirritated hemorrhoids can look like small skin tags coming out of the anus.   Thrombosed hemorrhoids can form when a hemorrhoid blood  vessel bursts and causes the hemorrhoid to suddenly swell.  A purple blood clot can form in it and become an excruciatingly painful lump at the anus. Because of these unpleasant symptoms, immediate incision and drainage by a surgeon at an office visit can provide much relief of the pain.    PREVENTION Avoiding the most frequent causes listed below will prevent most cases of hemorrhoids: Constipation Hard stools Diarrhea  Constant sitting  Straining with bowel movements Sitting on the toilet for a long time  Severe coughing  episodes Pregnancy / Childbirth  Heavy Lifting  Sometimes avoiding the above triggers is difficult:  How can you avoid sitting all day if you have a seated job? Also, we try to avoid coughing and diarrhea, but sometimes it's beyond your control.  Still, there are some practical hints to help: Keep the anal and genital area clean.  Moistened tissues such as flushable wet wipes are less irritating than toilet paper.  Using irrigating showers or bottle irrigation washing gently cleans this sensitive area.   Avoid dry toilet paper when cleaning after bowel movements.  Marland Kitchen Keep the anal and genital area dry.  Lightly pat the rectal area dry.  Avoid rubbing.  Talcum or baby powders can help GET YOUR STOOLS SOFT.   This is the most important way to prevent irritated hemorrhoids.  Hard stools are like sandpaper to the anorectal canal and will cause more problems.  The goal: ONE SOFT BOWEL MOVEMENT A DAY!  BMs from every other day to 3 times a day is a tolerable range Treat coughing, diarrhea and constipation early  since irritated hemorrhoids may soon follow.  If your main job activity is seated, always stand or walk during your breaks. Make it a point to stand and walk at least 5 minutes every hour and try to shift frequently in your chair to avoid direct rectal pressure.  Always exhale as you strain or lift. Don't hold your breath.  Do not delay or try to prevent a bowel movement when  the urge is present. Exercise regularly (walking or jogging 60 minutes a day) to stimulate the bowels to move. No reading or other activity while on the toilet. If bowel movements take longer than 5 minutes, you are too constipated. AVOID CONSTIPATION Drink plenty of liquids (1 1/2 to 2 quarts of water and other fluids a day unless fluid restricted for another medical condition). Liquids that contain caffeine (coffee a, tea, soft drinks) can be dehydrating and should be avoided until constipation is controlled. Consider minimizing milk, as dairy products may be constipating. Eat plenty of fiber (30g a day ideal, more if needed).  Fiber is the undigested part of plant food that passes into the colon, acting as "natures broom" to encourage bowel motility and movement.  Fiber can absorb and hold large amounts of water. This results in a larger, bulkier stool, which is soft and easier to pass.  Eating foods high in fiber - 12 servings - such as  Vegetables: Root (potatoes, carrots, turnips), Leafy green (lettuce, salad greens, celery, spinach), High residue (cabbage, broccoli, etc.) Fruit: Fresh, Dried (prunes, apricots, cherries), Stewed (applesauce)  Whole grain breads, pasta, whole wheat Bran cereals, muffins, etc. Consider adding supplemental bulking fiber which retains large volumes of water: Psyllium ground seeds --available as Metamucil, Konsyl, Effersyllium, Per Diem Fiber, or the less expensive generic forms.  Citrucel  (methylcellulose wood fiber) . FiberCon (Polycarbophil) Polyethylene Glycol - and "artificial" fiber commonly called Miralax or Glycolax.  It is helpful for people with gassy or bloated feelings with regular fiber Flax Seed - a less gassy natural fiber  Laxatives can be useful for a short period if constipation is severe Osmotics (Milk of Magnesia, Fleets Phospho-Soda, Magnesium Citrate)  Stimulants (Senokot,   Castor Oil,  Dulcolax, Ex-Lax)    Laxatives are not a good  long-term solution as it can stress the bowels and cause too much mineral loss and dehydration.   Avoid taking laxatives for more than 7 days in a row.  AVOID DIARRHEA Switch to liquids and simpler foods for a few days to avoid stressing your intestines further. Avoid dairy products (especially milk & ice cream) for a short time.  The intestines often can lose the ability to digest lactose when stressed. Avoid foods that cause gassiness or bloating.  Typical foods include beans and other legumes, cabbage, broccoli, and dairy foods.  Every person has some sensitivity to other foods, so listen to your body and avoid those foods that trigger problems for you. Adding fiber (Citrucel, Metamucil, FiberCon, Flax seed, Miralax) gradually can help thicken stools by absorbing excess fluid and retrain the intestines to act more normally.  Slowly increase the dose over a few weeks.  Too much fiber too soon can backfire and cause cramping & bloating. Probiotics (such as active yogurt, Align, etc) may help repopulate the intestines and colon with normal bacteria and calm down a sensitive digestive tract.  Most studies show it to be of mild help, though, and such products can be costly. Medicines: Bismuth subsalicylate (ex. Kayopectate, Pepto Bismol) every 30 minutes for  up to 6 doses can help control diarrhea.  Avoid if pregnant. Loperamide (Immodium) can slow down diarrhea.  Start with two tablets (4mg  total) first and then try one tablet every 6 hours.  Avoid if you are having fevers or severe pain.  If you are not better or start feeling worse, stop all medicines and call your doctor for advice Call your doctor if you are getting worse or not better.  Sometimes further testing (cultures, endoscopy, X-ray studies, bloodwork, etc) may be needed to help diagnose and treat the cause of the diarrhea. TREATMENT OF HEMORRHOID FLARE If these preventive measures fail, you must take action right away! Hemorrhoids are one  condition that can be mild in the morning and become intolerable by nightfall. Most hemorrhoidal flares take several weeks to calm down.  These suggestions can help: Warm soaks.  This helps more than any topical medication.  Use up to 8 times a day.  Usually sitz baths or sitting in a warm bathtub helps.  Sitting on moist warm towels are helpful.  Switching to ice packs/cool compresses can be helpful Normalize your bowels.  Extremes of diarrhea or constipation will make hemorrhoids worse.  One soft bowel movement a day is the goal.  Fiber can help get your bowels regular Wet wipes instead of toilet paper Pain control with a NSAID such as ibuprofen (Advil) or naproxen (Aleve) or acetaminophen (Tylenol) around the clock.  Narcotics are constipating and should be minimized if possible Topical creams contain steroids (bydrocortisone) or local anesthetic (xylocaine) can help make pain and itching more tolerable.   EVALUATION If hemorrhoids are still causing problems, you could benefit by an evaluation by a surgeon.  The surgeon will obtain a history and examine you.  If hemorrhoids are diagnosed, some therapies can be offered in the office, usually with an anoscope into the less sensitive area of the rectum: -injection of hemorrhoids (sclerotherapy) can scar the blood vessels of the swollen/enlarged hemorrhoids to help shrink them down to a more normal size -rubber banding of the enlarged hemorrhoids to help shrink them down to a more normal size -drainage of the blood clot causing a thrombosed hemorrhoid,  to relieve the severe pain   While 90% of the time such problems from hemorrhoids can be managed without preceding to surgery, sometimes the hemorrhoids require a operation to control the problem (uncontrolled bleeding, prolapse, pain, etc.).   This involves being placed under general anesthesia where the surgeon can confirm the diagnosis and remove, suture, or staple the hemorrhoid(s).  Your surgeon can  help you treat the problem appropriately.

## 2012-11-21 NOTE — Progress Notes (Signed)
Subjective:     Patient ID: Walter Black, male   DOB: 31-Mar-1961, 52 y.o.   MRN: [id number]  HPI  Walter Black  1960-11-20 [id number]  Patient Care Team: Loree Fee as PCP - General (Physician Assistant) Rachael Fee, MD as Consulting Physician (Gastroenterology) Ardeth Sportsman, MD as Consulting Physician (General Surgery)  This patient is a 52 y.o.male who presents today for surgical evaluation at the request of Dr. Christella Hartigan.   Reason for visit: Hemorrhoids with symptoms  Pleasant active male.  Former deep layer.  Has had intermittent hemorrhoid piles flares for the past two years.  Had a colonoscopy last year that was normal.  Does not get pain with defecation.  After after bowel movements often get burning itching and irritation.  Flares are intermittent.  Had a more intense one more recently.  Saw his gastroenterologist.  Was started on Citrucel.  Was sent to me with anticipation of needing further intervention.  The patient feels much better now.  No more symptoms.  No bleeding.  He does get sensitivity to oils and fats and dairy.  Often triggers diarrhea.  Normally has two bowel movements a day.  Somewhat loose.  If he gets a flare, topical treatments often help.  Was given Analpram recently.  Citrucel has helped thicken up his stools.  He can walk up to 60 minutes without problems.  He takes a spin class every other day.  No personal nor family history of GI/colon cancer, inflammatory bowel disease, allergy such as Celiac Sprue, colitis, ulcers nor gastritis.  No recent sick contacts/gastroenteritis.  No travel outside the country.  No changes in diet.    Patient Active Problem List   Diagnosis Date Noted  . IBS (irritable bowel syndrome) - mild w diarrhea 11/21/2012    Past Medical History  Diagnosis Date  . Hypertension 2000  . Arthritis     hips now resolved s/p surgery; still has it in lower back  . Cat allergies   . Environmental allergies     Past Surgical  History  Procedure Laterality Date  . 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 Marital Status: Single    Spouse Name: N/A    Number of Children: N/A  . Years of Education: N/A   Occupational History  . Not on file.   Social History Main Topics  . Smoking status: Current Some Day Smoker    Types: Cigars  . Smokeless tobacco: Never Used  . Alcohol Use: Yes     Comment: daily  . Drug Use: Not on file  . Sexually Active: Not on file   Other Topics Concern  . Not on file   Social History Narrative  . No narrative on file    Family History  Problem Relation Age of Onset  . Colon cancer Neg Hx   . Cancer Mother     "lymph node"    Current Outpatient Prescriptions  Medication Sig Dispense Refill  . celecoxib (CELEBREX) 200 MG capsule Take 200 mg by mouth daily.      Marland Kitchen loratadine-pseudoephedrine (CLARITIN-D 24-HOUR) 10-240 MG per 24 hr tablet Take 1 tablet by mouth as needed.      . mometasone (NASONEX) 50 MCG/ACT nasal spray Place 2 sprays into the nose daily.      . nebivolol (BYSTOLIC) 10 MG tablet Take 10 mg by mouth daily.      Marland Kitchen  traMADol (ULTRAM) 50 MG tablet Take 50 mg by mouth every 6 (six) hours as needed.      . pramoxine-hydrocortisone (ANALPRAM HC) cream Apply to affected area twice daily AS NEEDED.  30 g  1   No current facility-administered medications for this visit.     No Known Allergies  BP 104/82  Pulse 62  Resp 18  Ht 6\' 1"  (1.854 m)  Wt 226 lb (102.513 kg)  BMI 29.82 kg/m2  Ct Chest W Contrast  11/02/2012   *RADIOLOGY REPORT*  Clinical Data: Follow-up evaluation of pulmonary nodule.  CT CHEST WITH CONTRAST  Technique:  Multidetector CT imaging of the chest was performed following the standard protocol during bolus administration of intravenous contrast.  Contrast: 75mL OMNIPAQUE IOHEXOL 300 MG/ML  SOLN  Comparison: CT of the abdomen and pelvis 10/15/2011.  Chest CT  10/27/2011.  Findings:  Mediastinum: Heart size is normal. There is no significant pericardial fluid, thickening or pericardial calcification. No pathologically enlarged mediastinal or hilar lymph nodes. Esophagus is unremarkable in appearance.  Lungs/Pleura: Previously noted 2 mm and left lower lobe nodule is completely unchanged and can be considered benign (image 31 of series 4).  No other larger more suspicious appearing pulmonary nodules or masses are otherwise noted.  No acute consolidative air space disease.  No pleural effusions.  Upper Abdomen: Unremarkable.  Musculoskeletal: There are no aggressive appearing lytic or blastic lesions noted in the visualized portions of the skeleton.  IMPRESSION: 1.  No significant change in a 2 mm nodule in the left lower lobe, as above.  This can be considered benign and requires no further J follow-up. This recommendation follows the consensus statement: Guidelines for Management of Small Pulmonary Nodules Detected on CT Scans:  A Statement from the Fleischner Society as published in Radiology 2005; 237:395-400.   Original Report Authenticated By: Trudie Reed, M.D.     Review of Systems  Constitutional: Negative for fever, chills and diaphoresis.  HENT: Negative for nosebleeds, sore throat, facial swelling, mouth sores, trouble swallowing and ear discharge.   Eyes: Negative for photophobia, discharge and visual disturbance.  Respiratory: Negative for choking, chest tightness, shortness of breath and stridor.   Cardiovascular: Negative for chest pain and palpitations.  Gastrointestinal: Positive for rectal pain. Negative for nausea, vomiting, abdominal pain, diarrhea, constipation, blood in stool, abdominal distention and anal bleeding.  Endocrine: Negative for cold intolerance and heat intolerance.  Genitourinary: Negative for dysuria, urgency, difficulty urinating and testicular pain.  Musculoskeletal: Negative for myalgias, back pain, arthralgias and  gait problem.  Skin: Negative for color change, pallor, rash and wound.  Allergic/Immunologic: Negative for environmental allergies and food allergies.  Neurological: Negative for dizziness, speech difficulty, weakness, numbness and headaches.  Hematological: Negative for adenopathy. Does not bruise/bleed easily.  Psychiatric/Behavioral: Negative for hallucinations, confusion and agitation.       Objective:   Physical Exam  Constitutional: He is oriented to person, place, and time. He appears well-developed and well-nourished. No distress.  HENT:  Head: Normocephalic.  Mouth/Throat: Oropharynx is clear and moist. No oropharyngeal exudate.  Eyes: Conjunctivae and EOM are normal. Pupils are equal, round, and reactive to light. No scleral icterus.  Neck: Normal range of motion. Neck supple. No tracheal deviation present.  Cardiovascular: Normal rate, regular rhythm and intact distal pulses.   Pulmonary/Chest: Effort normal and breath sounds normal. No respiratory distress.  Abdominal: Soft. He exhibits no distension. There is no tenderness. Hernia confirmed negative in the right inguinal area and confirmed  negative in the left inguinal area.  Genitourinary:  Exam done with assistance of male Medical Assistant in the room.  Perianal skin clean with good hygiene.  No pruritis.  No external skin tags / hemorrhoids of significance.  No pilonidal disease.  No fissure.  No abscess/fistula.    Tolerates digital and anoscopic rectal exam.  Normal sphincter tone.   No rectal masses.  Hemorrhoidal R post pile mod enlarged - not prolapsing.  (R ant & L lat piles mildly inflammed)  Musculoskeletal: Normal range of motion. He exhibits no tenderness.  Lymphadenopathy:    He has no cervical adenopathy.       Right: No inguinal adenopathy present.       Left: No inguinal adenopathy present.  Neurological: He is alert and oriented to person, place, and time. No cranial nerve deficit. He exhibits normal  muscle tone. Coordination normal.  Skin: Skin is warm and dry. No rash noted. He is not diaphoretic. No erythema. No pallor.  Psychiatric: He has a normal mood and affect. His behavior is normal. Judgment and thought content normal.       Assessment:     Noninflamed right posterior hemorrhoid.  Symptoms resolved.  Sensitivity to fats & dairy suspicious for mild IBS diarrhea type.  Improved on Citrucel     Plan:     The anatomy & physiology of the anorectal region was discussed.  The pathophysiology of hemorrhoids and differential diagnosis was discussed.  Natural history progression  was discussed.   I stressed the importance of a bowel regimen to have daily soft bowel movements to minimize progression of disease.   Goal of one BM / day ideal.  Use of wet wipes, warm baths, avoiding straining, etc were emphasized.  Educational handouts further explaining the pathology, treatment options, and bowel regimen were given as well.   The patient expressed understanding.  He would like to hold off on any banding or intervention since he is doing better on the Citrucel.  If not, he will call us for banding in the office.  I doubt it will progress to the point requiring operation unless he has worsening diarrhea/hemorrhoids triggers.

## 2013-07-17 ENCOUNTER — Other Ambulatory Visit: Payer: Self-pay | Admitting: Emergency Medicine

## 2013-07-17 MED ORDER — NEBIVOLOL HCL 10 MG PO TABS: 10.0000 mg | ORAL_TABLET | Freq: Every day | ORAL | Status: DC

## 2013-07-31 ENCOUNTER — Encounter: Payer: Self-pay | Admitting: Emergency Medicine

## 2013-08-02 ENCOUNTER — Ambulatory Visit (INDEPENDENT_AMBULATORY_CARE_PROVIDER_SITE_OTHER): Payer: 59 | Admitting: Emergency Medicine

## 2013-08-02 ENCOUNTER — Encounter: Payer: Self-pay | Admitting: Emergency Medicine

## 2013-08-02 VITALS — BP 122/80 | HR 70 | Temp 98.6°F | Resp 18 | Ht 72.75 in | Wt 224.0 lb

## 2013-08-02 DIAGNOSIS — J309 Allergic rhinitis, unspecified: Secondary | ICD-10-CM

## 2013-08-02 DIAGNOSIS — Z9109 Other allergy status, other than to drugs and biological substances: Secondary | ICD-10-CM

## 2013-08-02 DIAGNOSIS — J029 Acute pharyngitis, unspecified: Secondary | ICD-10-CM

## 2013-08-02 DIAGNOSIS — I10 Essential (primary) hypertension: Secondary | ICD-10-CM

## 2013-08-02 MED ORDER — AZITHROMYCIN 250 MG PO TABS: ORAL_TABLET | ORAL | Status: AC

## 2013-08-02 MED ORDER — PREDNISONE 10 MG PO TABS: ORAL_TABLET | ORAL | Status: DC

## 2013-08-02 NOTE — Progress Notes (Signed)
   Subjective:    Patient ID: Walter Black, male    DOB: [birth year], 53 y.o.   MRN: [id number]  HPI Comments: 53 yo male with increased ST and allergy drainage increased. He using Claritin/ Dymista/ Mucinex without relief. He has noticed increased pressure on left side of face.   Sore Throat  Associated symptoms include congestion.   Current Outpatient Prescriptions on File Prior to Visit  Medication Sig Dispense Refill  . loratadine-pseudoephedrine (CLARITIN-D 24-HOUR) 10-240 MG per 24 hr tablet Take 1 tablet by mouth as needed.      . nebivolol (BYSTOLIC) 10 MG tablet Take 1 tablet (10 mg total) by mouth daily.  90 tablet  1  . traMADol (ULTRAM) 50 MG tablet Take 50 mg by mouth every 6 (six) hours as needed.      . pramoxine-hydrocortisone (ANALPRAM HC) cream Apply to affected area twice daily AS NEEDED.  30 g  1   No current facility-administered medications on file prior to visit.   ALLERGIES Hctz  Past Medical History  Diagnosis Date  . Hypertension 2000  . Arthritis     hips now resolved s/p surgery; still has it in lower back  . Cat allergies   . Environmental allergies       Review of Systems  HENT: Positive for congestion, postnasal drip, sinus pressure and sore throat.   All other systems reviewed and are negative.   BP 122/80  Pulse 70  Temp(Src) 98.6 F (37 C) (Temporal)  Resp 18  Ht 6' 0.75" (1.848 m)  Wt 224 lb (101.606 kg)  BMI 29.75 kg/m2     Objective:   Physical Exam  Nursing note and vitals reviewed. Constitutional: He is oriented to person, place, and time. He appears well-developed and well-nourished.  HENT:  Head: Normocephalic and atraumatic.  Right Ear: External ear normal.  Left Ear: External ear normal.  Nose: Nose normal.  Mouth/Throat: Oropharynx is clear and moist. No oropharyngeal exudate.  2 + erythematous tonsils with exudate  Eyes: Conjunctivae are normal.  Neck: Normal range of motion.  Left > right  Cardiovascular:  Normal rate, regular rhythm, normal heart sounds and intact distal pulses.   Pulmonary/Chest: Effort normal and breath sounds normal.  Abdominal: Soft.  Musculoskeletal: Normal range of motion.  Lymphadenopathy:    He has cervical adenopathy.  Neurological: He is alert and oriented to person, place, and time.  Skin: Skin is warm and dry.  Psychiatric: He has a normal mood and affect. Judgment normal.          Assessment & Plan:  Pharyngitis/ Allergic rhinitis- Allegra OTC, increase H2o, allergy hygiene explained. Pred DP 10 mg/ Zpak both AD. Warm salt water gargles daily. 1 tsp liquid benadryl + 1 tsp liquid Maalox, MIX/ GARGLE/ SPIT as needed for pain w/c if SX increase or ER.

## 2013-08-02 NOTE — Patient Instructions (Signed)
Allergic Rhinitis Allergic rhinitis is when the mucous membranes in the nose respond to allergens. Allergens are particles in the air that cause your body to have an allergic reaction. This causes you to release allergic antibodies. Through a chain of events, these eventually cause you to release histamine into the blood stream. Although meant to protect the body, it is this release of histamine that causes your discomfort, such as frequent sneezing, congestion, and an itchy, runny nose.  CAUSES  Seasonal allergic rhinitis (hay fever) is caused by pollen allergens that may come from grasses, trees, and weeds. Year-round allergic rhinitis (perennial allergic rhinitis) is caused by allergens such as house dust mites, pet dander, and mold spores.  SYMPTOMS   Nasal stuffiness (congestion).  Itchy, runny nose with sneezing and tearing of the eyes. DIAGNOSIS  Your health care provider can help you determine the allergen or allergens that trigger your symptoms. If you and your health care provider are unable to determine the allergen, skin or blood testing may be used. TREATMENT  Allergic Rhinitis does not have a cure, but it can be controlled by:  Medicines and allergy shots (immunotherapy).  Avoiding the allergen. Hay fever may often be treated with antihistamines in pill or nasal spray forms. Antihistamines block the effects of histamine. There are over-the-counter medicines that may help with nasal congestion and swelling around the eyes. Check with your health care provider before taking or giving this medicine.  If avoiding the allergen or the medicine prescribed do not work, there are many new medicines your health care provider can prescribe. Stronger medicine may be used if initial measures are ineffective. Desensitizing injections can be used if medicine and avoidance does not work. Desensitization is when a patient is given ongoing shots until the body becomes less sensitive to the allergen.  Make sure you follow up with your health care provider if problems continue. HOME CARE INSTRUCTIONS It is not possible to completely avoid allergens, but you can reduce your symptoms by taking steps to limit your exposure to them. It helps to know exactly what you are allergic to so that you can avoid your specific triggers. SEEK MEDICAL CARE IF:   You have a fever.  You develop a cough that does not stop easily (persistent).  You have shortness of breath.  You start wheezing.  Symptoms interfere with normal daily activities. Document Released: 03/17/2001 Document Revised: 04/12/2013 Document Reviewed: 02/27/2013 [facility] Patient Information 2014 [place]. Pharyngitis Warm salt water gargles daily. 1 tsp liquid benadryl + 1 tsp liquid Maalox, MIX/ GARGLE/ SPIT as needed for pain  Pharyngitis is a sore throat (pharynx). There is redness, pain, and swelling of your throat. HOME CARE   Drink enough fluids to keep your pee (urine) clear or pale yellow.  Only take medicine as told by your doctor.  You may get sick again if you do not take medicine as told. Finish your medicines, even if you start to feel better.  Do not take aspirin.  Rest.  Rinse your mouth (gargle) with salt water ( tsp of salt per 1 qt of water) every 1 2 hours. This will help the pain.  If you are not at risk for choking, you can suck on hard candy or sore throat lozenges. GET HELP IF:  You have large, tender lumps on your neck.  You have a rash.  You cough up green, yellow-Kina, or bloody spit. GET HELP RIGHT AWAY IF:   You have a stiff neck.  You drool or cannot swallow liquids.  You throw up (vomit) or are not able to keep medicine or liquids down.  You have very bad pain that does not go away with medicine.  You have problems breathing (not from a stuffy nose). MAKE SURE YOU:   Understand these instructions.  Will watch your condition.  Will get help right away if you are not  doing well or get worse. Document Released: 12/09/2007 Document Revised: 04/12/2013 Document Reviewed: 02/27/2013 [facility] Patient Information 2014 [place].

## 2013-08-04 DIAGNOSIS — Z9109 Other allergy status, other than to drugs and biological substances: Secondary | ICD-10-CM | POA: Insufficient documentation

## 2013-08-04 DIAGNOSIS — I10 Essential (primary) hypertension: Secondary | ICD-10-CM | POA: Insufficient documentation

## 2013-08-11 IMAGING — CT CT CHEST W/ CM
2 of 3 series · 13 of 30 positions shown, 15 images · IV contrast (75CC OMNI 300)
Comparison: CT of the abdomen and pelvis 10/15/2011.  Chest CT
10/27/2011.

CLINICAL DATA: Follow-up evaluation of pulmonary nodule.

CT CHEST WITH CONTRAST
TECHNIQUE: Multidetector CT imaging of the chest was performed
following the standard protocol during bolus administration of
intravenous contrast.
Contrast: 75mL OMNIPAQUE IOHEXOL 300 MG/ML  SOLN

[Series 3: chest with · axial · 0.78mm/px · z∈[-188,+2]mm · 5 of 58 slices shown, 7 images]
[im 10/58  mediastinal]
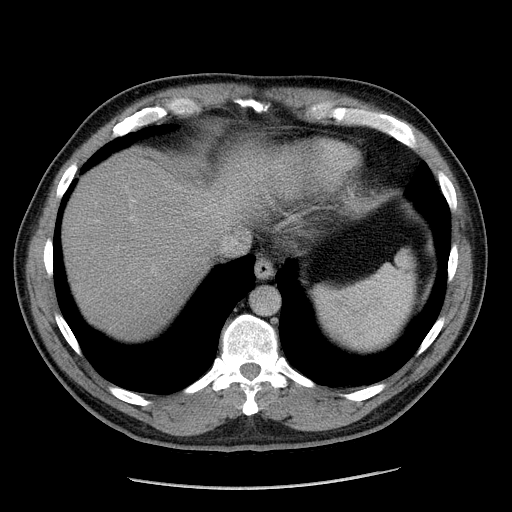
[im 10/58  lung]
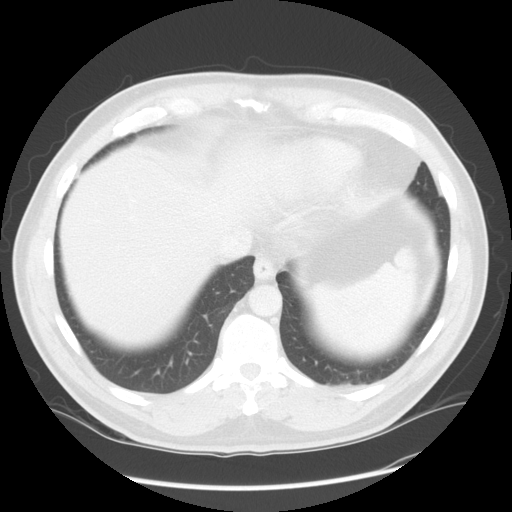
[im 20/58  lung]
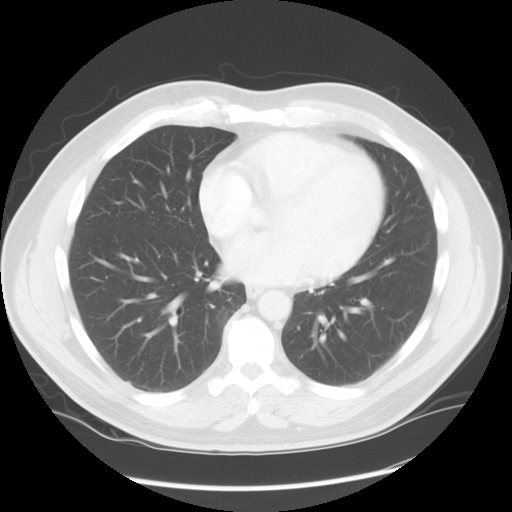
[im 29/58  lung]
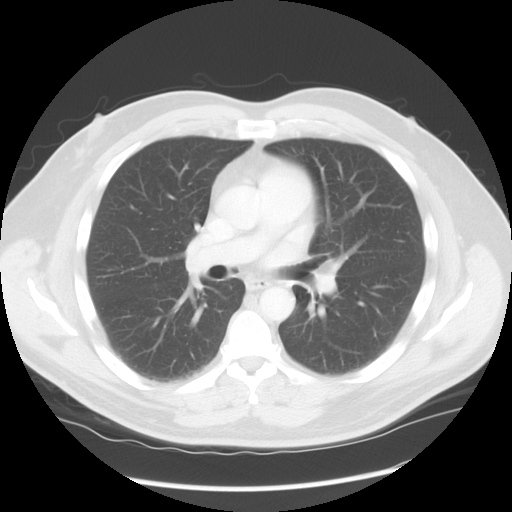
[im 39/58  lung]
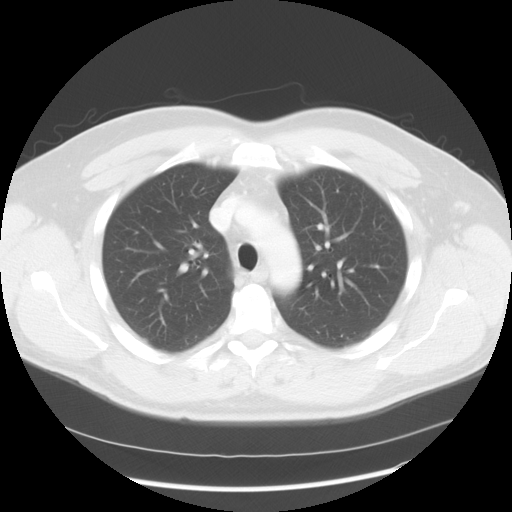
[im 48/58  mediastinal]
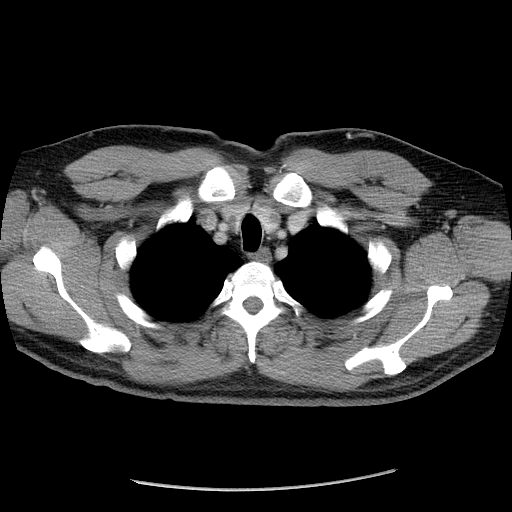
[im 48/58  lung]
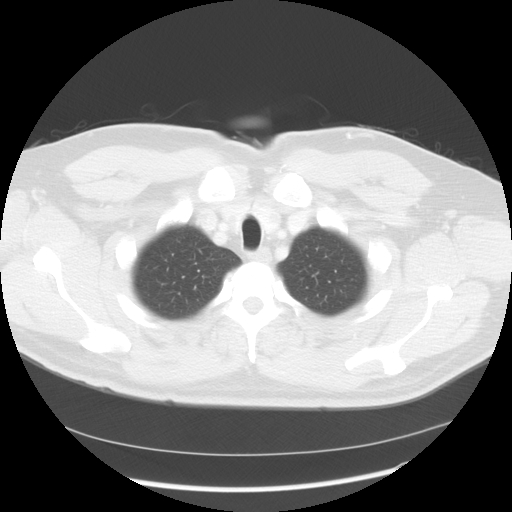

[Series 602: sagittal body · sagittal · 0.78mm/px · 8 of 161 slices shown]
[im 17/161  mediastinal]
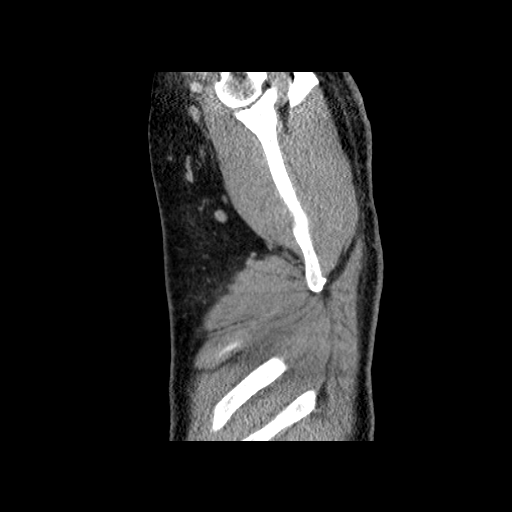
[im 33/161  mediastinal]
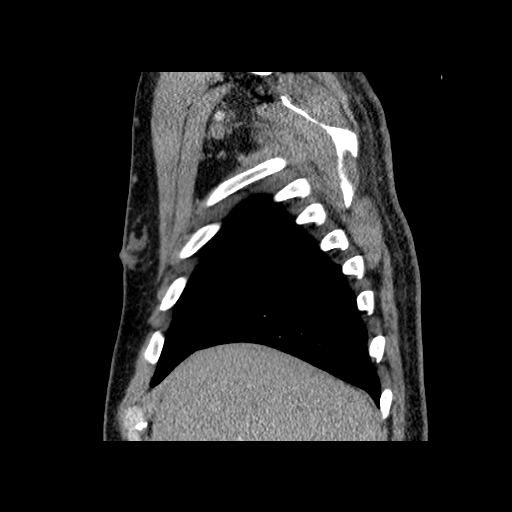
[im 49/161  mediastinal]
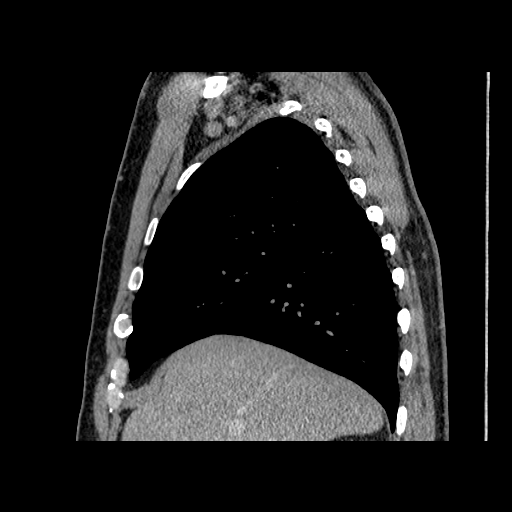
[im 73/161  mediastinal]
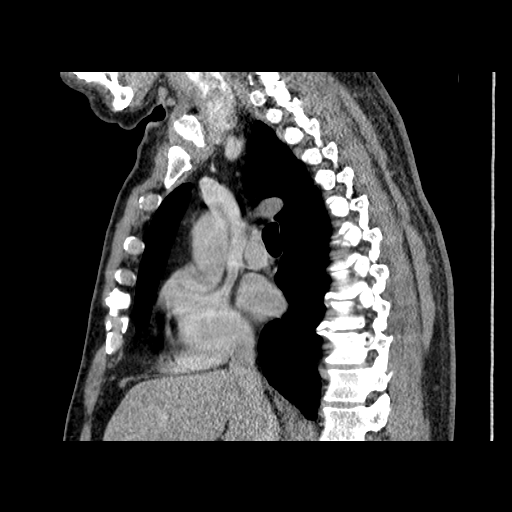
[im 89/161  mediastinal]
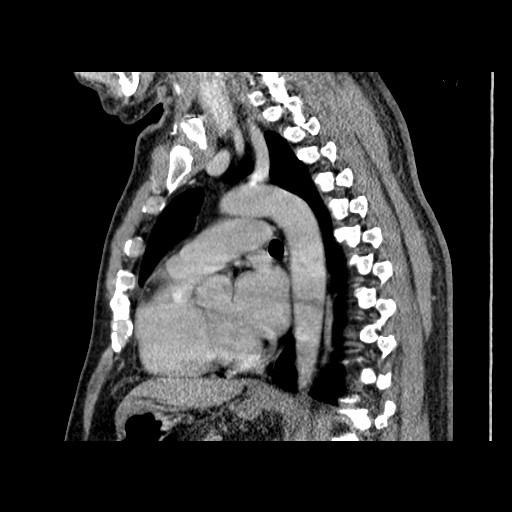
[im 113/161  mediastinal]
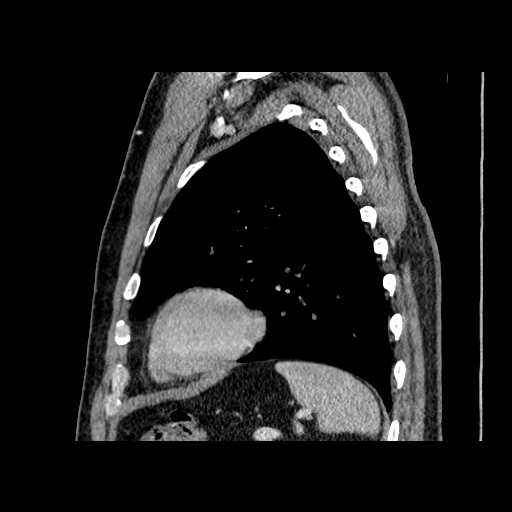
[im 129/161  mediastinal]
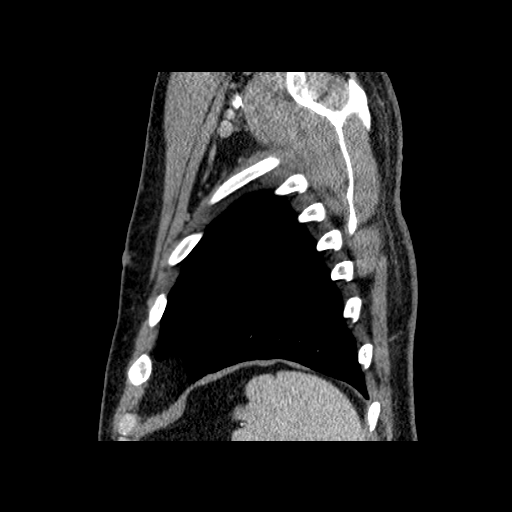
[im 145/161  mediastinal]
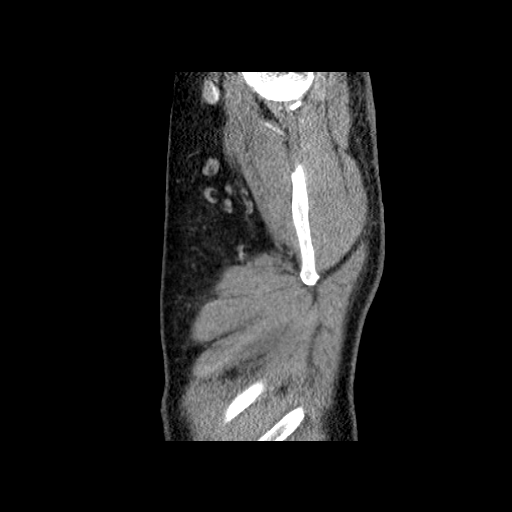

[13 of 30 positions shown; findings below may reference images not displayed]

FINDINGS: Mediastinum: Heart size is normal. There is no significant
pericardial fluid, thickening or pericardial calcification. No
pathologically enlarged mediastinal or hilar lymph nodes. Esophagus
is unremarkable in appearance.

Lungs/Pleura: Previously noted 2 mm and left lower lobe nodule is
completely unchanged and can be considered benign (image 31 of
series 4).  No other larger more suspicious appearing pulmonary
nodules or masses are otherwise noted.  No acute consolidative air
space disease.  No pleural effusions.

Upper Abdomen: Unremarkable.

Musculoskeletal: There are no aggressive appearing lytic or blastic
lesions noted in the visualized portions of the skeleton.
IMPRESSION: 1.  No significant change in a 2 mm nodule in the left lower lobe,
as above.  This can be considered benign and requires no further J
follow-up. This recommendation follows the consensus statement:
Guidelines for Management of Small Pulmonary Nodules Detected on CT
Scans:  A Statement from the [HOSPITAL] as published in

## 2013-08-25 ENCOUNTER — Encounter: Payer: Self-pay | Admitting: *Deleted

## 2013-08-31 ENCOUNTER — Encounter: Payer: Self-pay | Admitting: Emergency Medicine

## 2013-10-19 ENCOUNTER — Other Ambulatory Visit: Payer: Self-pay | Admitting: Emergency Medicine

## 2014-01-25 ENCOUNTER — Other Ambulatory Visit: Payer: Self-pay | Admitting: Internal Medicine

## 2014-03-01 ENCOUNTER — Ambulatory Visit (INDEPENDENT_AMBULATORY_CARE_PROVIDER_SITE_OTHER): Payer: 59 | Admitting: Internal Medicine

## 2014-03-01 ENCOUNTER — Encounter: Payer: Self-pay | Admitting: Internal Medicine

## 2014-03-01 VITALS — BP 130/80 | HR 76 | Temp 98.1°F | Resp 16 | Ht 72.75 in | Wt 213.2 lb

## 2014-03-01 DIAGNOSIS — B354 Tinea corporis: Secondary | ICD-10-CM

## 2014-03-01 MED ORDER — KETOCONAZOLE 2 % EX CREA: 1.0000 "application " | TOPICAL_CREAM | Freq: Two times a day (BID) | CUTANEOUS | Status: DC

## 2014-03-01 MED ORDER — TERBINAFINE HCL 250 MG PO TABS: 250.0000 mg | ORAL_TABLET | Freq: Every day | ORAL | Status: AC

## 2014-03-01 NOTE — Progress Notes (Signed)
   Subjective:    Patient ID: Walter Black, male    DOB: Jun 17, 1961, 53 y.o.   MRN: [id number]  HPI  Very nice 53 yo WM w/ rash in both axilla refractory to OTC antifungal and triple Antibiotic ointment.   Medication List   cyanocobalamin 1000 MCG/ML injection  Commonly known as:  (VITAMIN B-12)  Inject 1,000 mcg into the muscle every 14 (fourteen) days.     DYMISTA 137-50 MCG/ACT Susp  Generic drug:  Azelastine-Fluticasone  Place into the nose.     loratadine-pseudoephedrine 10-240 MG per 24 hr tablet  Commonly known as:  LORATADINE-D 24HR  TAKE ONE TABLET BY MOUTH ONCE DAILY     nebivolol 10 MG tablet  Commonly known as:  BYSTOLIC  Take 1 tablet (10 mg total) by mouth daily.     traMADol 50 MG tablet  Commonly known as:  ULTRAM  Take 50 mg by mouth every 6 (six) hours as needed.     Allergies  Allergen Reactions  . Hctz [Hydrochlorothiazide]     ED   Past Medical History  Diagnosis Date  . Hypertension 2000  . Arthritis     hips now resolved s/p surgery; still has it in lower back  . Cat allergies   . Environmental allergies    Review of Systems  Non contributory Objective:   Physical Exam BP 130/80  Pulse 76  Temp(Src) 98.1 F (36.7 C) (Temporal)  Resp 16  Ht 6' 0.75" (1.848 m)  Wt 213 lb 3.2 oz (96.707 kg)  BMI 28.32 kg/m2  Pink sl raised velvety confluent rash bilat axillae Lt>Rt. No signs of cellulitis.  Assessment & Plan:   1. Tinea corporis -Rx Nizoral crm 2% bid -Rx Lanisil 250 mg #15 x 1 rf

## 2014-03-06 ENCOUNTER — Other Ambulatory Visit: Payer: Self-pay | Admitting: Emergency Medicine

## 2014-03-27 ENCOUNTER — Other Ambulatory Visit: Payer: Self-pay | Admitting: Internal Medicine

## 2014-04-10 ENCOUNTER — Other Ambulatory Visit: Payer: Self-pay | Admitting: Physician Assistant

## 2014-04-10 MED ORDER — AZITHROMYCIN 250 MG PO TABS: ORAL_TABLET | ORAL | Status: AC

## 2014-04-18 ENCOUNTER — Encounter: Payer: Self-pay | Admitting: Emergency Medicine

## 2014-05-10 ENCOUNTER — Encounter: Payer: Self-pay | Admitting: Emergency Medicine

## 2014-05-10 ENCOUNTER — Ambulatory Visit (INDEPENDENT_AMBULATORY_CARE_PROVIDER_SITE_OTHER): Payer: 59 | Admitting: Emergency Medicine

## 2014-05-10 VITALS — BP 138/80 | Temp 98.6°F | Resp 18 | Ht 72.75 in | Wt 215.0 lb

## 2014-05-10 DIAGNOSIS — R6889 Other general symptoms and signs: Secondary | ICD-10-CM

## 2014-05-10 DIAGNOSIS — Z0000 Encounter for general adult medical examination without abnormal findings: Secondary | ICD-10-CM

## 2014-05-10 DIAGNOSIS — Z1212 Encounter for screening for malignant neoplasm of rectum: Secondary | ICD-10-CM

## 2014-05-10 DIAGNOSIS — I10 Essential (primary) hypertension: Secondary | ICD-10-CM

## 2014-05-10 DIAGNOSIS — Z0001 Encounter for general adult medical examination with abnormal findings: Secondary | ICD-10-CM

## 2014-05-10 DIAGNOSIS — Z23 Encounter for immunization: Secondary | ICD-10-CM

## 2014-05-10 DIAGNOSIS — Z111 Encounter for screening for respiratory tuberculosis: Secondary | ICD-10-CM

## 2014-05-10 DIAGNOSIS — Z125 Encounter for screening for malignant neoplasm of prostate: Secondary | ICD-10-CM

## 2014-05-10 LAB — CBC WITH DIFFERENTIAL/PLATELET
Basophils Absolute: 0.1 10*3/uL (ref 0.0–0.1)
Basophils Relative: 1 % (ref 0–1)
Eosinophils Absolute: 0.2 10*3/uL (ref 0.0–0.7)
Eosinophils Relative: 3 % (ref 0–5)
HCT: 45 % (ref 39.0–52.0)
HEMOGLOBIN: 15.6 g/dL (ref 13.0–17.0)
LYMPHS ABS: 2.2 10*3/uL (ref 0.7–4.0)
Lymphocytes Relative: 31 % (ref 12–46)
MCH: 32.1 pg (ref 26.0–34.0)
MCHC: 34.7 g/dL (ref 30.0–36.0)
MCV: 92.6 fL (ref 78.0–100.0)
MONOS PCT: 9 % (ref 3–12)
Monocytes Absolute: 0.6 10*3/uL (ref 0.1–1.0)
NEUTROS ABS: 4 10*3/uL (ref 1.7–7.7)
NEUTROS PCT: 56 % (ref 43–77)
Platelets: 234 10*3/uL (ref 150–400)
RBC: 4.86 MIL/uL (ref 4.22–5.81)
RDW: 12.8 % (ref 11.5–15.5)
WBC: 7.1 10*3/uL (ref 4.0–10.5)

## 2014-05-10 LAB — HEMOGLOBIN A1C
Hgb A1c MFr Bld: 5.2 % (ref <5.7)
MEAN PLASMA GLUCOSE: 103 mg/dL (ref <117)

## 2014-05-10 MED ORDER — AZELASTINE HCL 0.15 % NA SOLN: NASAL | Status: DC

## 2014-05-10 MED ORDER — FLUTICASONE PROPIONATE 50 MCG/ACT NA SUSP: NASAL | Status: DC

## 2014-05-10 MED ORDER — CETIRIZINE-PSEUDOEPHEDRINE ER 5-120 MG PO TB12: 1.0000 | ORAL_TABLET | Freq: Two times a day (BID) | ORAL | Status: DC

## 2014-05-10 NOTE — Patient Instructions (Signed)
Tuberculin Skin Test The PPD skin test is a method used to help with the diagnosis of a disease called tuberculosis (TB). HOW THE TEST IS DONE  The test site (usually the forearm) is cleansed. The PPD extract is then injected under the top layer of skin, causing a blister to form on the skin. The reaction will take 48 - 72 hours to develop. You must return to your health care provider within that time to have the area checked. This will determine whether you have had a significant reaction to the PPD test. A reaction is measured in millimeters of hard swelling (induration) at the site. PREPARATION FOR TEST  There is no special preparation for this test. People with a skin rash or other skin irritations on their arms may need to have the test performed at a different spot on the body. Tell your health care provider if you have ever had a positive PPD skin test. If so, you should not have a repeat PPD test. Tell your doctor if you have a medical condition or if you take certain drugs, such as steroids, that can affect your immune system. These situations may lead to inaccurate test results. NORMAL FINDINGS A negative reaction (no induration) or a level of hard swelling that falls below a certain cutoff may mean that a person has not been infected with the bacteria that cause TB. There are different cutoffs for children, people with HIV, and other risk groups. Unfortunately, this is not a perfect test, and up to 20% of people infected with tuberculosis may not have a reaction on the PPD skin test. In addition, certain conditions that affect the immune system (cancer, recent chemotherapy, late-stage AIDS) may cause a false-negative test result.  The reaction will take 48 - 72 hours to develop. You must return to your health care provider within that time to have the area checked. Follow your caregiver's instructions as to where and when to report for this to be done. Ranges for normal findings may vary  among different laboratories and hospitals. You should always check with your doctor after having lab work or other tests done to discuss the meaning of your test results and whether your values are considered within normal limits. WHAT ABNORMAL RESULTS MEAN  The results of the test depend on the size of the skin reaction and on the person being tested.  A small reaction (5 mm of hard swelling at the site) is considered to be positive in people who have HIV, who are taking steroid therapy, or who have been in close contact with a person who has active tuberculosis. Larger reactions (greater than or equal to 10 mm) are considered positive in people with diabetes or kidney failure, and in health care workers, among others. In people with no known risks for tuberculosis, a positive reaction requires 15 mm or more of hard swelling at the site. RISKS AND COMPLICATIONS There is a very small risk of severe redness and swelling of the arm in people who have had a previous positive PPD test and who have the test again. There also have been a few rare cases of this reaction in people who have not been tested before. CONSIDERATIONS  A positive skin test does not necessarily mean that a person has active tuberculosis. More tests will be done to check whether active disease is present. Many people who were born outside the [place] may have had a vaccine called "BCG," which can lead to a false-positive test   result. MEANING OF TEST  Your caregiver will go over the test results with you and discuss the importance and meaning of your results, as well as treatment options and the need for additional tests if necessary. OBTAINING THE TEST RESULTS It is your responsibility to obtain your test results. Ask the lab or department performing the test when and how you will get your results. Document Released: 04/01/2005 Document Revised: 09/14/2011 Document Reviewed: 09/29/2013 [facility] Patient Information 2015  [place], [place]. This information is not intended to replace advice given to you by your health care provider. Make sure you discuss any questions you have with your health care provider. Contact Dermatitis Contact dermatitis is a rash that happens when something touches the skin. You touched something that irritates your skin, or you have allergies to something you touched. HOME CARE   Avoid the thing that caused your rash.  Keep your rash away from hot water, soap, sunlight, chemicals, and other things that might bother it.  Do not scratch your rash.  You can take cool baths to help stop itching.  Only take medicine as told by your doctor.  Keep all doctor visits as told. GET HELP RIGHT AWAY IF:   Your rash is not better after 3 days.  Your rash gets worse.  Your rash is puffy (swollen), tender, red, sore, or warm.  You have problems with your medicine. MAKE SURE YOU:   Understand these instructions.  Will watch your condition.  Will get help right away if you are not doing well or get worse. Document Released: 04/19/2009 Document Revised: 09/14/2011 Document Reviewed: 11/25/2010 [facility] Patient Information 2015 [place], [place]. This information is not intended to replace advice given to you by your health care provider. Make sure you discuss any questions you have with your health care provider.

## 2014-05-10 NOTE — Progress Notes (Signed)
Subjective:    Patient ID: Walter Black, male    DOB: August 27, 1960, 53 y.o.   MRN: [id number]  HPI Comments: 53 yo WM CPE and HTN follow up. He has stopped BP RX with increased exercise, better diet, and weight loss. He had started to have some occasional dizziness with low BP but that resolved with d/c RX.  He was treated by Dr Nelva Bush for pinched nerve in neck which resolved with prednisone this summer.   He was treated with rash under arms in 02/2014 with hydrocortisone-Rx Nizoral crm 2% bid, -Rx Lanisil 250 mg #15 x 1 rf. He notes rash has improved but still with areas of irritation. He notes redness/ itch under arms and at edges of sleeves. He has tried multiple OTC antifungals/ antibacterials without relief.                 Hypertension Pertinent negatives include no chest pain or shortness of breath.     Medication List       This list is accurate as of: 05/10/14 11:59 PM.  Always use your most recent med list.               Azelastine HCl 0.15 % Soln  1 spray each nostril in p.m.     celecoxib 200 MG capsule  Commonly known as:  CELEBREX  Take 200 mg by mouth daily as needed.     cetirizine-pseudoephedrine 5-120 MG per tablet  Commonly known as:  ZYRTEC-D  Take 1 tablet by mouth 2 (two) times daily.     fluticasone 50 MCG/ACT nasal spray  Commonly known as:  FLONASE  1 spray both nostrils in a.m.     ketoconazole 2 % cream  Commonly known as:  NIZORAL  APPLY  CREAM TOPICALLY TWICE DAILY     traMADol 50 MG tablet  Commonly known as:  ULTRAM  Take 50 mg by mouth every 6 (six) hours as needed.       Allergies  Allergen Reactions  . Hctz [Hydrochlorothiazide]     ED   Past Medical History  Diagnosis Date  . Hypertension 2000  . Arthritis     hips now resolved s/p surgery; still has it in lower back  . Cat allergies   . Environmental allergies   . DDD (degenerative disc disease), lumbar   . ED (erectile dysfunction)   . B12 deficiency   . Colon polyp     Past Surgical History  Procedure Laterality Date  . Replacement total hip w/  resurfacing implants  2002 left; 2009 right    bilateral  . Joint replacement Bilateral 2002/2009   History  Substance Use Topics  . Smoking status: Current Some Day Smoker    Types: Cigars  . Smokeless tobacco: Never Used  . Alcohol Use: 6.0 oz/week    10 Not specified per week     Comment: daily   Family History  Problem Relation Age of Onset  . Colon cancer Neg Hx   . Cancer Mother     "lymph node"  . Diabetes Mother    MAINTENANCE: Colonoscopy:2013 polyp 2018 TIR:WERXVQM 2013 WNL Dentist:q 6 month, 7/15  IMMUNIZATIONS: GQQP:6195 Influenza:2014  Patient Care Team: Unk Pinto, MD as PCP - General (Internal Medicine) Milus Banister, MD as Consulting Physician (Gastroenterology) Michael Boston, MD as Consulting Physician (General Surgery) Johna Sheriff, MD as Consulting Physician (Ophthalmology) Claybon Jabs, MD as Consulting Physician (Urology) Cindee Salt, MD as Consulting Physician (  Physical Medicine and Rehabilitation) Simona Huh, MD as Consulting Physician (Dermatology) Mosetta Anis, MD as Referring Physician (Allergy)   Review of Systems  Respiratory: Negative for shortness of breath.   Cardiovascular: Negative for chest pain.  Skin: Positive for rash.  All other systems reviewed and are negative.  BP 138/80 mmHg  Temp(Src) 98.6 F (37 C) (Temporal)  Resp 18  Ht 6' 0.75" (1.848 m)  Wt 215 lb (97.523 kg)  BMI 28.56 kg/m2     Objective:   Physical Exam  Constitutional: He is oriented to person, place, and time. He appears well-developed and well-nourished.  HENT:  Head: Normocephalic and atraumatic.  Right Ear: External ear normal.  Left Ear: External ear normal.  Nose: Nose normal.  Eyes: Conjunctivae and EOM are normal. Pupils are equal, round, and reactive to light. Right eye exhibits no discharge. Left eye exhibits no discharge. No  scleral icterus.  Neck: Normal range of motion. Neck supple. No JVD present. No tracheal deviation present. No thyromegaly present.  Cardiovascular: Normal rate, regular rhythm, normal heart sounds and intact distal pulses.   Varicose veins L>R soft NT  Pulmonary/Chest: Effort normal and breath sounds normal.  Abdominal: Soft. Bowel sounds are normal. He exhibits no distension and no mass. There is no tenderness. There is no rebound and no guarding.  Genitourinary:  Def to GU  Musculoskeletal: Normal range of motion. He exhibits no edema or tenderness.  Lymphadenopathy:    He has no cervical adenopathy.  Neurological: He is alert and oriented to person, place, and time. He has normal reflexes. No cranial nerve deficit. He exhibits normal muscle tone. Coordination normal.  Skin: Skin is warm and dry. Rash noted. No erythema. No pallor.     Scalp/ crown 1 cm erythematous scaling. Gets full at [place]: He has a normal mood and affect. His behavior is normal. Judgment and thought content normal.  Nursing note and vitals reviewed.      AORTA SCAN WNL EKG NSCSPT  Assessment & Plan:  1. CPE- Update screening labs/ History/ Immunizations/ Testing as needed. Advised healthy diet, QD exercise, increase H20 and continue RX/ Vitamins AD.  2. HTN- Check BP call if >130/80, increase cardio, continue to monitor off BP RX, restart if stays above 130/80  3. Rash- ? Fungal vs allergic reaction- Discussed hygiene and change of clothes with white cotton shirts under polos, change to gold bond DO/ powder, w/c if no relief for repeat RX antifungal treatment  4. Allergic rhinitis- Allegra OTC, increase H2o, allergy hygiene explained. Switch Dymista to Flonase in a.m. And Astepro in p.m.  5.  Irreg Nevi-  call for removal/ full skin check at [facility]

## 2014-05-11 LAB — TSH: TSH: 1.727 u[IU]/mL (ref 0.350–4.500)

## 2014-05-11 LAB — MICROALBUMIN / CREATININE URINE RATIO
CREATININE, URINE: 43 mg/dL
MICROALB/CREAT RATIO: 4.7 mg/g (ref 0.0–30.0)
Microalb, Ur: 0.2 mg/dL (ref <2.0)

## 2014-05-11 LAB — BASIC METABOLIC PANEL WITH GFR
BUN: 16 mg/dL (ref 6–23)
CALCIUM: 9.5 mg/dL (ref 8.4–10.5)
CO2: 28 meq/L (ref 19–32)
Chloride: 97 mEq/L (ref 96–112)
Creat: 0.85 mg/dL (ref 0.50–1.35)
GFR, Est African American: >89 mL/min
GFR, Est Non African American: >89 mL/min
GLUCOSE: 94 mg/dL (ref 70–99)
Potassium: 4 mEq/L (ref 3.5–5.3)
Sodium: 138 mEq/L (ref 135–145)

## 2014-05-11 LAB — HEPATIC FUNCTION PANEL
ALBUMIN: 4.5 g/dL (ref 3.5–5.2)
ALK PHOS: 50 U/L (ref 39–117)
ALT: 13 U/L (ref 0–53)
AST: 20 U/L (ref 0–37)
Bilirubin, Direct: 0.1 mg/dL (ref 0.0–0.3)
Indirect Bilirubin: 0.4 mg/dL (ref 0.2–1.2)
TOTAL PROTEIN: 6.8 g/dL (ref 6.0–8.3)
Total Bilirubin: 0.5 mg/dL (ref 0.2–1.2)

## 2014-05-11 LAB — URINALYSIS, ROUTINE W REFLEX MICROSCOPIC
Bilirubin Urine: NEGATIVE
Glucose, UA: NEGATIVE mg/dL
Hgb urine dipstick: NEGATIVE
Ketones, ur: NEGATIVE mg/dL
LEUKOCYTES UA: NEGATIVE
Nitrite: NEGATIVE
PH: 6 (ref 5.0–8.0)
PROTEIN: NEGATIVE mg/dL
Specific Gravity, Urine: 1.008 (ref 1.005–1.030)
Urobilinogen, UA: 0.2 mg/dL (ref 0.0–1.0)

## 2014-05-11 LAB — LIPID PANEL
CHOLESTEROL: 150 mg/dL (ref 0–200)
HDL: 54 mg/dL (ref >39)
LDL Cholesterol: 72 mg/dL (ref 0–99)
Total CHOL/HDL Ratio: 2.8 Ratio
Triglycerides: 119 mg/dL (ref <150)
VLDL: 24 mg/dL (ref 0–40)

## 2014-05-11 LAB — MAGNESIUM: Magnesium: 2 mg/dL (ref 1.5–2.5)

## 2014-05-11 LAB — TESTOSTERONE: Testosterone: 365 ng/dL (ref 300–890)

## 2014-05-11 LAB — VITAMIN B12: VITAMIN B 12: 386 pg/mL (ref 211–911)

## 2014-05-11 LAB — INSULIN, FASTING: Insulin fasting, serum: 7.3 u[IU]/mL (ref 2.0–19.6)

## 2014-05-11 LAB — VITAMIN D 25 HYDROXY (VIT D DEFICIENCY, FRACTURES): Vit D, 25-Hydroxy: 50 ng/mL (ref 30–89)

## 2014-05-11 LAB — PSA: PSA: 1 ng/mL (ref <=4.00)

## 2014-05-14 LAB — TB SKIN TEST
Induration: 0 mm
TB Skin Test: NEGATIVE

## 2014-05-17 ENCOUNTER — Encounter: Payer: Self-pay | Admitting: Emergency Medicine

## 2014-05-17 DIAGNOSIS — Z860100 Personal history of colon polyps, unspecified: Secondary | ICD-10-CM | POA: Insufficient documentation

## 2014-05-17 DIAGNOSIS — M5136 Other intervertebral disc degeneration, lumbar region: Secondary | ICD-10-CM | POA: Insufficient documentation

## 2014-05-17 DIAGNOSIS — N529 Male erectile dysfunction, unspecified: Secondary | ICD-10-CM | POA: Insufficient documentation

## 2014-05-22 ENCOUNTER — Other Ambulatory Visit: Payer: Self-pay | Admitting: Internal Medicine

## 2014-05-22 ENCOUNTER — Other Ambulatory Visit: Payer: Self-pay | Admitting: *Deleted

## 2014-05-22 MED ORDER — IPRATROPIUM BROMIDE 0.06 % NA SOLN: 2.0000 | Freq: Three times a day (TID) | NASAL | Status: DC

## 2014-05-24 ENCOUNTER — Encounter: Payer: Self-pay | Admitting: Internal Medicine

## 2014-05-25 ENCOUNTER — Other Ambulatory Visit: Payer: Self-pay | Admitting: Internal Medicine

## 2014-05-25 MED ORDER — KETOCONAZOLE 2 % EX CREA: TOPICAL_CREAM | Freq: Two times a day (BID) | CUTANEOUS | Status: DC | PRN

## 2014-05-27 ENCOUNTER — Other Ambulatory Visit: Payer: Self-pay | Admitting: Emergency Medicine

## 2014-05-27 MED ORDER — TERBINAFINE HCL 250 MG PO TABS: 250.0000 mg | ORAL_TABLET | Freq: Every day | ORAL | Status: DC

## 2014-09-03 ENCOUNTER — Encounter: Payer: Self-pay | Admitting: Emergency Medicine

## 2014-09-04 ENCOUNTER — Other Ambulatory Visit: Payer: Self-pay | Admitting: *Deleted

## 2014-09-04 MED ORDER — CETIRIZINE-PSEUDOEPHEDRINE ER 5-120 MG PO TB12: 1.0000 | ORAL_TABLET | Freq: Two times a day (BID) | ORAL | Status: DC

## 2014-10-11 ENCOUNTER — Other Ambulatory Visit (INDEPENDENT_AMBULATORY_CARE_PROVIDER_SITE_OTHER): Payer: Self-pay

## 2014-10-11 DIAGNOSIS — Z0000 Encounter for general adult medical examination without abnormal findings: Secondary | ICD-10-CM

## 2014-10-11 DIAGNOSIS — Z1212 Encounter for screening for malignant neoplasm of rectum: Secondary | ICD-10-CM

## 2014-10-11 LAB — POC HEMOCCULT BLD/STL (HOME/3-CARD/SCREEN)
Card #2 Fecal Occult Blod, POC: NEGATIVE
Card #3 Fecal Occult Blood, POC: NEGATIVE
Fecal Occult Blood, POC: NEGATIVE

## 2015-05-15 ENCOUNTER — Encounter: Payer: Self-pay | Admitting: Internal Medicine

## 2015-05-22 ENCOUNTER — Encounter: Payer: Self-pay | Admitting: Internal Medicine

## 2015-05-22 ENCOUNTER — Ambulatory Visit (INDEPENDENT_AMBULATORY_CARE_PROVIDER_SITE_OTHER): Payer: 59 | Admitting: Internal Medicine

## 2015-05-22 VITALS — BP 142/84 | HR 76 | Temp 98.4°F | Resp 16 | Ht 72.75 in | Wt 216.0 lb

## 2015-05-22 DIAGNOSIS — I10 Essential (primary) hypertension: Secondary | ICD-10-CM

## 2015-05-22 DIAGNOSIS — Z23 Encounter for immunization: Secondary | ICD-10-CM

## 2015-05-22 DIAGNOSIS — Z125 Encounter for screening for malignant neoplasm of prostate: Secondary | ICD-10-CM

## 2015-05-22 DIAGNOSIS — Z96649 Presence of unspecified artificial hip joint: Secondary | ICD-10-CM | POA: Insufficient documentation

## 2015-05-22 DIAGNOSIS — Z1212 Encounter for screening for malignant neoplasm of rectum: Secondary | ICD-10-CM

## 2015-05-22 DIAGNOSIS — Z1329 Encounter for screening for other suspected endocrine disorder: Secondary | ICD-10-CM

## 2015-05-22 DIAGNOSIS — K635 Polyp of colon: Secondary | ICD-10-CM

## 2015-05-22 DIAGNOSIS — E349 Endocrine disorder, unspecified: Secondary | ICD-10-CM

## 2015-05-22 DIAGNOSIS — K589 Irritable bowel syndrome without diarrhea: Secondary | ICD-10-CM

## 2015-05-22 DIAGNOSIS — Z0001 Encounter for general adult medical examination with abnormal findings: Secondary | ICD-10-CM

## 2015-05-22 DIAGNOSIS — Z0000 Encounter for general adult medical examination without abnormal findings: Secondary | ICD-10-CM | POA: Diagnosis not present

## 2015-05-22 DIAGNOSIS — Z79899 Other long term (current) drug therapy: Secondary | ICD-10-CM

## 2015-05-22 DIAGNOSIS — E538 Deficiency of other specified B group vitamins: Secondary | ICD-10-CM

## 2015-05-22 DIAGNOSIS — E559 Vitamin D deficiency, unspecified: Secondary | ICD-10-CM

## 2015-05-22 DIAGNOSIS — R2232 Localized swelling, mass and lump, left upper limb: Secondary | ICD-10-CM

## 2015-05-22 DIAGNOSIS — Z13220 Encounter for screening for lipoid disorders: Secondary | ICD-10-CM

## 2015-05-22 DIAGNOSIS — Z131 Encounter for screening for diabetes mellitus: Secondary | ICD-10-CM

## 2015-05-22 HISTORY — DX: Presence of unspecified artificial hip joint: Z96.649

## 2015-05-22 LAB — HEPATIC FUNCTION PANEL
ALBUMIN: 4.5 g/dL (ref 3.6–5.1)
ALK PHOS: 58 U/L (ref 40–115)
ALT: 20 U/L (ref 9–46)
AST: 21 U/L (ref 10–35)
BILIRUBIN DIRECT: 0.1 mg/dL (ref <=0.2)
BILIRUBIN TOTAL: 0.6 mg/dL (ref 0.2–1.2)
Indirect Bilirubin: 0.5 mg/dL (ref 0.2–1.2)
Total Protein: 7.2 g/dL (ref 6.1–8.1)

## 2015-05-22 LAB — CBC WITH DIFFERENTIAL/PLATELET
BASOS ABS: 0 10*3/uL (ref 0.0–0.1)
Basophils Relative: 0 % (ref 0–1)
EOS ABS: 0.2 10*3/uL (ref 0.0–0.7)
Eosinophils Relative: 2 % (ref 0–5)
HCT: 47.2 % (ref 39.0–52.0)
Hemoglobin: 16.2 g/dL (ref 13.0–17.0)
LYMPHS ABS: 2.3 10*3/uL (ref 0.7–4.0)
LYMPHS PCT: 29 % (ref 12–46)
MCH: 32.5 pg (ref 26.0–34.0)
MCHC: 34.3 g/dL (ref 30.0–36.0)
MCV: 94.6 fL (ref 78.0–100.0)
MPV: 9.5 fL (ref 8.6–12.4)
Monocytes Absolute: 0.9 10*3/uL (ref 0.1–1.0)
Monocytes Relative: 11 % (ref 3–12)
NEUTROS ABS: 4.6 10*3/uL (ref 1.7–7.7)
NEUTROS PCT: 58 % (ref 43–77)
PLATELETS: 248 10*3/uL (ref 150–400)
RBC: 4.99 MIL/uL (ref 4.22–5.81)
RDW: 12.3 % (ref 11.5–15.5)
WBC: 8 10*3/uL (ref 4.0–10.5)

## 2015-05-22 LAB — LIPID PANEL
CHOL/HDL RATIO: 3 ratio (ref <=5.0)
Cholesterol: 162 mg/dL (ref 125–200)
HDL: 54 mg/dL (ref >=40)
LDL CALC: 80 mg/dL (ref <130)
Triglycerides: 141 mg/dL (ref <150)
VLDL: 28 mg/dL (ref <30)

## 2015-05-22 LAB — BASIC METABOLIC PANEL WITH GFR
BUN: 14 mg/dL (ref 7–25)
CHLORIDE: 101 mmol/L (ref 98–110)
CO2: 26 mmol/L (ref 20–31)
Calcium: 9.6 mg/dL (ref 8.6–10.3)
Creat: 0.84 mg/dL (ref 0.70–1.33)
GFR, Est African American: >89 mL/min (ref >=60)
GFR, Est Non African American: >89 mL/min (ref >=60)
Glucose, Bld: 96 mg/dL (ref 65–99)
POTASSIUM: 4.1 mmol/L (ref 3.5–5.3)
Sodium: 139 mmol/L (ref 135–146)

## 2015-05-22 LAB — IRON AND TIBC
%SAT: 39 % (ref 15–60)
Iron: 134 ug/dL (ref 50–180)
TIBC: 343 ug/dL (ref 250–425)
UIBC: 209 ug/dL (ref 125–400)

## 2015-05-22 LAB — TSH: TSH: 1.599 u[IU]/mL (ref 0.350–4.500)

## 2015-05-22 LAB — MAGNESIUM: Magnesium: 2.1 mg/dL (ref 1.5–2.5)

## 2015-05-22 LAB — HEMOGLOBIN A1C
HEMOGLOBIN A1C: 5.4 % (ref <5.7)
Mean Plasma Glucose: 108 mg/dL (ref <117)

## 2015-05-22 LAB — VITAMIN B12: Vitamin B-12: 295 pg/mL (ref 211–911)

## 2015-05-22 MED ORDER — HYDROCORTISONE ACETATE 1 % EX CREA: TOPICAL_CREAM | CUTANEOUS | Status: DC

## 2015-05-22 MED ORDER — FLUTICASONE PROPIONATE 0.05 % EX CREA: TOPICAL_CREAM | Freq: Two times a day (BID) | CUTANEOUS | Status: DC | PRN

## 2015-05-22 MED ORDER — FLUTICASONE PROPIONATE 50 MCG/ACT NA SUSP: NASAL | Status: DC

## 2015-05-22 MED ORDER — CETIRIZINE-PSEUDOEPHEDRINE ER 5-120 MG PO TB12: 1.0000 | ORAL_TABLET | Freq: Two times a day (BID) | ORAL | Status: DC

## 2015-05-22 MED ORDER — IPRATROPIUM BROMIDE 0.06 % NA SOLN: 2.0000 | Freq: Three times a day (TID) | NASAL | Status: DC

## 2015-05-22 NOTE — Addendum Note (Signed)
Addended by: Braylin Formby A on: 05/22/2015 03:30 PM   Modules accepted: Orders

## 2015-05-22 NOTE — Patient Instructions (Signed)

## 2015-05-22 NOTE — Progress Notes (Signed)
Patient ID: Walter Black, male   DOB: 03-08-1961, 54 y.o.   MRN: [id number]    Annual Screening Comprehensive Examination   This very nice 54 y.o.male presents for complete physical.  Patient has a history of allergic rhinits, HTN controlled by diet and exercise, and history of IBS with a colonic polyp found on colonoscpy..  Patient reports no complaints at this time.   Finally, patient has history of Vitamin D Deficiency and last vitamin D was  Lab Results  Component Value Date   VD25OH 50 05/10/2014  .  Currently on supplementation  He reports that he has had some dry coughing with occasional mild yellow sputum production. He has been using a nettipot at home and that has helped. He reports that everyone in the house has had it.    He also reports that for the past couple months he has been having a growth on his left forearm. 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  He reports that it was initially painful and now it is not.     Current Outpatient Prescriptions on File Prior to Visit  Medication Sig Dispense Refill  . celecoxib (CELEBREX) 200 MG capsule Take 200 mg by mouth daily as needed.    . traMADol (ULTRAM) 50 MG tablet Take 50 mg by mouth every 6 (six) hours as needed.     No current facility-administered medications on file prior to visit.    Allergies  Allergen Reactions  . Hctz [Hydrochlorothiazide]     ED    Past Medical History  Diagnosis Date  . Hypertension 2000  . Arthritis     hips now resolved s/p surgery; still has it in lower back  . Cat allergies   . Environmental allergies   . DDD (degenerative disc disease), lumbar   . ED (erectile dysfunction)   . B12 deficiency   . Colon polyp     Immunization History  Administered Date(s) Administered  . Influenza Split 05/10/2014  . Influenza-Unspecified 05/06/2011, 04/18/2013  . PPD Test 05/10/2014  . Tdap 07/29/2011    Past Surgical History   Procedure Laterality Date  . Replacement total hip w/  resurfacing implants  2002 left; 2009 right    bilateral  . Joint replacement Bilateral 2002/2009    Family History  Problem Relation Age of Onset  . Colon cancer Neg Hx   . Cancer Mother     "lymph node"  . Diabetes Mother     Social History   Social History  . Marital Status: Single    Spouse Name: N/A  . Number of Children: N/A  . Years of Education: N/A   Occupational History  . Not on file.   Social History Main Topics  . Smoking status: Current Some Day Smoker    Types: Cigars  . Smokeless tobacco: Never Used  . Alcohol Use: 6.0 oz/week    10 Standard drinks or equivalent per week     Comment: daily  . Drug Use: Not on file  . Sexual Activity: Not on file   Other Topics Concern  . Not on file   Social History Narrative   Review of Systems  Constitutional: Negative for fever, chills, weight loss and malaise/fatigue.  HENT: Negative for congestion, ear pain and sore throat.   Eyes: Negative.   Respiratory: Negative for cough, shortness of breath and wheezing.   Cardiovascular: Negative for chest pain, palpitations and leg swelling.  Gastrointestinal: Negative for heartburn, nausea, vomiting, diarrhea, constipation, blood in stool and melena.  Genitourinary: Negative.   Skin: Positive for rash (underneath underarms).  Neurological: Negative for dizziness, sensory change, loss of consciousness and headaches.  Psychiatric/Behavioral: Negative for depression and suicidal ideas. The patient is not nervous/anxious and does not have insomnia.       Physical Exam  BP 142/84 mmHg  Pulse 76  Temp(Src) 98.4 F (36.9 C) (Temporal)  Resp 16  Ht 6' 0.75" (1.848 m)  Wt 216 lb (97.977 kg)  BMI 28.69 kg/m2   Wt Readings from Last 3 Encounters:  05/22/15 216 lb (97.977 kg)  05/10/14 215 lb (97.523 kg)  03/01/14 213 lb 3.2 oz (96.707 kg)     General Appearance: Well nourished and in no apparent  distress. Eyes: PERRLA, EOMs, conjunctiva no swelling or erythema, normal fundi and vessels. Sinuses: No frontal/maxillary tenderness ENT/Mouth: EACs patent / TMs  nl. Nares clear without erythema, swelling, mucoid exudates. Oral hygiene is good. No erythema, swelling, or exudate. Tongue normal, non-obstructing. Tonsils not swollen or erythematous. Hearing normal.  Neck: Supple, thyroid normal. No bruits, nodes or JVD. Respiratory: Respiratory effort normal.  BS equal and clear bilateral without rales, rhonci, wheezing or stridor. Cardio: Heart sounds are normal with regular rate and rhythm and no murmurs, rubs or gallops. Peripheral pulses are normal and equal bilaterally without edema. No aortic or femoral bruits. Chest: symmetric with normal excursions and percussion.  Abdomen: Flat, soft, with bowl sounds. Nontender, no guarding, rebound, hernias, masses, or organomegaly.  Lymphatics: Non tender without lymphadenopathy.   Musculoskeletal: Full ROM all peripheral extremities, joint stability, 5/5 strength, and normal gait. Skin: Warm and dry without rashes, lesions, cyanosis, clubbing or  ecchymosis. Firm well demarcated 1 cm round palpable nodule to the left forearm.   Neuro: Cranial nerves intact, reflexes equal bilaterally. Normal muscle tone, no cerebellar symptoms. Sensation intact.  Pysch: Awake and oriented X 3, normal affect, Insight and Judgment appropriate.    Assessment and Plan  1. Essential hypertension  - Urinalysis, Routine w reflex microscopic (not at [facility]) - Microalbumin / creatinine urine ratio - EKG 12-Lead - [place], RETROPERITNL ABD,  LTD - TSH  2. IBS (irritable bowel syndrome) - mild w diarrhea -currently asymptomatic  3. Colon polyp -path on previous polyp benign, due for colonoscopy in 2023  4. B12 deficiency  - Vitamin B12 - Iron and TIBC  5. Encounter for general adult medical examination with abnormal findings   6. Screening for diabetes mellitus  -  Hemoglobin A1c - Insulin, random  7. Screening for hyperlipidemia  - Lipid panel  8. Screening for thyroid disorder -TSH  9. Screening for prostate cancer  - PSA  10. Screening for rectal cancer  - POC Hemoccult Bld/Stl (3-Cd Home Screen); Future  11. S/P total hip arthroplasty -currently without problems  12. Arm mass, left -likely lipoma - Ambulatory referral to Orthopedics  13. Medication management  - CBC with Differential/Platelet - BASIC METABOLIC PANEL WITH GFR - Hepatic function panel - Magnesium  14. Testosterone deficiency  - Testosterone  15. Vitamin D deficiency  - VITAMIN D 25 Hydroxy (Vit-D Deficiency, Fractures)      Continue prudent diet as discussed, weight control, regular exercise, and medications. Routine screening labs and tests as requested with regular follow-up as recommended.  Over 40 minutes of exam, counseling, chart review and critical decision making was performed

## 2015-05-23 ENCOUNTER — Other Ambulatory Visit: Payer: Self-pay | Admitting: Internal Medicine

## 2015-05-23 ENCOUNTER — Other Ambulatory Visit: Payer: Self-pay | Admitting: *Deleted

## 2015-05-23 LAB — URINALYSIS, ROUTINE W REFLEX MICROSCOPIC
BILIRUBIN URINE: NEGATIVE
Glucose, UA: NEGATIVE
Hgb urine dipstick: NEGATIVE
KETONES UR: NEGATIVE
Leukocytes, UA: NEGATIVE
Nitrite: NEGATIVE
PROTEIN: NEGATIVE
Specific Gravity, Urine: 1.008 (ref 1.001–1.035)
pH: 6.5 (ref 5.0–8.0)

## 2015-05-23 LAB — MICROALBUMIN / CREATININE URINE RATIO
CREATININE, URINE: 46 mg/dL (ref 20–370)
Microalb, Ur: <0.2 mg/dL

## 2015-05-23 LAB — INSULIN, RANDOM: Insulin: 10 u[IU]/mL (ref 2.0–19.6)

## 2015-05-23 LAB — PSA: PSA: 0.86 ng/mL (ref <=4.00)

## 2015-05-23 LAB — TESTOSTERONE: TESTOSTERONE: 337 ng/dL (ref 300–890)

## 2015-05-23 LAB — VITAMIN D 25 HYDROXY (VIT D DEFICIENCY, FRACTURES): Vit D, 25-Hydroxy: 30 ng/mL (ref 30–100)

## 2015-05-23 MED ORDER — FLUTICASONE PROPIONATE 50 MCG/ACT NA SUSP: NASAL | Status: DC

## 2015-05-23 MED ORDER — PREDNISONE 20 MG PO TABS: ORAL_TABLET | ORAL | Status: DC

## 2015-05-23 MED ORDER — IPRATROPIUM BROMIDE 0.06 % NA SOLN: 2.0000 | Freq: Three times a day (TID) | NASAL | Status: DC

## 2015-05-23 MED ORDER — HYDROCORTISONE ACE-PRAMOXINE 1-1 % RE CREA: 1.0000 "application " | TOPICAL_CREAM | Freq: Two times a day (BID) | RECTAL | Status: DC | PRN

## 2015-07-18 ENCOUNTER — Other Ambulatory Visit: Payer: Self-pay | Admitting: Orthopedic Surgery

## 2016-03-11 ENCOUNTER — Other Ambulatory Visit: Payer: Self-pay | Admitting: Internal Medicine

## 2016-05-21 ENCOUNTER — Ambulatory Visit (INDEPENDENT_AMBULATORY_CARE_PROVIDER_SITE_OTHER): Payer: 59 | Admitting: Internal Medicine

## 2016-05-21 ENCOUNTER — Encounter: Payer: Self-pay | Admitting: Internal Medicine

## 2016-05-21 VITALS — BP 136/84 | HR 76 | Temp 98.2°F | Resp 18 | Ht 72.75 in | Wt 215.0 lb

## 2016-05-21 DIAGNOSIS — K589 Irritable bowel syndrome without diarrhea: Secondary | ICD-10-CM

## 2016-05-21 DIAGNOSIS — Z136 Encounter for screening for cardiovascular disorders: Secondary | ICD-10-CM

## 2016-05-21 DIAGNOSIS — Z23 Encounter for immunization: Secondary | ICD-10-CM

## 2016-05-21 DIAGNOSIS — Z131 Encounter for screening for diabetes mellitus: Secondary | ICD-10-CM

## 2016-05-21 DIAGNOSIS — E349 Endocrine disorder, unspecified: Secondary | ICD-10-CM

## 2016-05-21 DIAGNOSIS — E538 Deficiency of other specified B group vitamins: Secondary | ICD-10-CM

## 2016-05-21 DIAGNOSIS — Z1329 Encounter for screening for other suspected endocrine disorder: Secondary | ICD-10-CM

## 2016-05-21 DIAGNOSIS — I10 Essential (primary) hypertension: Secondary | ICD-10-CM

## 2016-05-21 DIAGNOSIS — Z0000 Encounter for general adult medical examination without abnormal findings: Secondary | ICD-10-CM | POA: Diagnosis not present

## 2016-05-21 DIAGNOSIS — Z125 Encounter for screening for malignant neoplasm of prostate: Secondary | ICD-10-CM

## 2016-05-21 DIAGNOSIS — Z13220 Encounter for screening for lipoid disorders: Secondary | ICD-10-CM

## 2016-05-21 DIAGNOSIS — Z0001 Encounter for general adult medical examination with abnormal findings: Secondary | ICD-10-CM

## 2016-05-21 DIAGNOSIS — E559 Vitamin D deficiency, unspecified: Secondary | ICD-10-CM

## 2016-05-21 LAB — CBC WITH DIFFERENTIAL/PLATELET
BASOS ABS: 0 {cells}/uL (ref 0–200)
Basophils Relative: 0 %
EOS PCT: 2 %
Eosinophils Absolute: 140 cells/uL (ref 15–500)
HCT: 45.7 % (ref 38.5–50.0)
Hemoglobin: 15.8 g/dL (ref 13.2–17.1)
LYMPHS ABS: 1680 {cells}/uL (ref 850–3900)
Lymphocytes Relative: 24 %
MCH: 32.7 pg (ref 27.0–33.0)
MCHC: 34.6 g/dL (ref 32.0–36.0)
MCV: 94.6 fL (ref 80.0–100.0)
MONOS PCT: 8 %
MPV: 9.6 fL (ref 7.5–12.5)
Monocytes Absolute: 560 cells/uL (ref 200–950)
NEUTROS ABS: 4620 {cells}/uL (ref 1500–7800)
Neutrophils Relative %: 66 %
PLATELETS: 216 10*3/uL (ref 140–400)
RBC: 4.83 MIL/uL (ref 4.20–5.80)
RDW: 12.4 % (ref 11.0–15.0)
WBC: 7 10*3/uL (ref 3.8–10.8)

## 2016-05-21 LAB — TSH: TSH: 1.69 m[IU]/L (ref 0.40–4.50)

## 2016-05-21 MED ORDER — FLUTICASONE PROPIONATE 50 MCG/ACT NA SUSP: NASAL | 3 refills | Status: DC

## 2016-05-21 MED ORDER — CETIRIZINE-PSEUDOEPHEDRINE ER 5-120 MG PO TB12: 1.0000 | ORAL_TABLET | Freq: Two times a day (BID) | ORAL | 0 refills | Status: DC

## 2016-05-21 MED ORDER — IPRATROPIUM BROMIDE 0.06 % NA SOLN: 2.0000 | Freq: Three times a day (TID) | NASAL | 3 refills | Status: DC

## 2016-05-21 MED ORDER — FLUTICASONE PROPIONATE 0.05 % EX CREA: TOPICAL_CREAM | Freq: Two times a day (BID) | CUTANEOUS | 2 refills | Status: DC | PRN

## 2016-05-21 NOTE — Progress Notes (Signed)
Complete Physical  Assessment and Plan:   1. Encounter for general adult medical examination with abnormal findings  - CBC with Differential/Platelet - BASIC METABOLIC PANEL WITH GFR - Hepatic function panel - Magnesium  2. Essential hypertension  - Urinalysis, Routine w reflex microscopic (not at [facility]) - Microalbumin / creatinine urine ratio - EKG 12-Lead - TSH  3. Screening for hyperlipidemia  - Lipid panel  4. Screening for diabetes mellitus  - Hemoglobin A1c - Insulin, random  5. Screening for thyroid disorder -TSH  6. B12 deficiency  - Iron and TIBC - Vitamin B12  7. Irritable bowel syndrome, unspecified type -well controlled lately -cont meds prn  8. Screening for prostate cancer  - PSA  9. Testosterone deficiency  - Testosterone  10. Vitamin D deficiency  - VITAMIN D 25 Hydroxy (Vit-D Deficiency, Fractures)  11. Need for prophylactic vaccination and inoculation against influenza  - Flu Vaccine QUAD with presevative   Discussed med's effects and SE's. Screening labs and tests as requested with regular follow-up as recommended.  HPI Patient presents for a complete physical.   His blood pressure has been controlled at home, today their BP is BP: 136/84 He does not workout. He denies chest pain, shortness of breath, dizziness.   He is not on cholesterol medication and denies myalgias. His cholesterol is at goal. The cholesterol last visit was:   Lab Results  Component Value Date   CHOL 162 05/22/2015   HDL 54 05/22/2015   LDLCALC 80 05/22/2015   TRIG 141 05/22/2015   CHOLHDL 3.0 05/22/2015    He has been working on diet and exercise for prediabetes, he is not on bASA, he is not on ACE/ARB and denies foot ulcerations, hyperglycemia, hypoglycemia , increased appetite, nausea, paresthesia of the feet, polydipsia, polyuria, visual disturbances, vomiting and weight loss. Last A1C in the office was:  Lab Results  Component Value Date   HGBA1C  5.4 05/22/2015    Patient is on Vitamin D supplement.   Lab Results  Component Value Date   VD25OH 30 05/22/2015      Last PSA was: Lab Results  Component Value Date   PSA 0.86 05/22/2015  .  Denies BPH symptoms daytime frequency, double voiding, dysuria, hematuria, hesitancy, incontinence, intermittency, nocturia, sensation of incomplete bladder emptying, suprapubic pain, urgency or weak urinary stream.   He reports that he has been doing well with his hip replacement.  He reports that he is having no issues.     Current Medications:  Current Outpatient Prescriptions on File Prior to Visit  Medication Sig Dispense Refill  . celecoxib (CELEBREX) 200 MG capsule Take 200 mg by mouth daily as needed.    . traMADol (ULTRAM) 50 MG tablet Take 50 mg by mouth every 6 (six) hours as needed.     No current facility-administered medications on file prior to visit.     Health Maintenance:  Immunization History  Administered Date(s) Administered  . Influenza Split 05/10/2014  . Influenza, Seasonal, Injecte, Preservative Fre 05/22/2015  . Influenza-Unspecified 05/06/2011, 04/18/2013  . PPD Test 05/10/2014  . Tdap 07/29/2011    Tetanus: 2013 Flu vaccine: Due today Colonoscopy:2013, sessile polyp Dr. Ardis Hughs recommended 10 years if path was normal Eye Exam:  Dr. Herbert Deaner  Patient Care Team: Unk Pinto, MD as PCP - General (Internal Medicine) Milus Banister, MD as Consulting Physician (Gastroenterology) Michael Boston, MD as Consulting Physician (General Surgery) Monna Fam, MD as Consulting Physician (Ophthalmology) Kathie Rhodes, MD as Consulting  Physician (Urology) Suella Broad, MD as Consulting Physician (Physical Medicine and Rehabilitation) Druscilla Brownie, MD as Consulting Physician (Dermatology) Mosetta Anis, MD as Referring Physician (Allergy)  Allergies:  Allergies  Allergen Reactions  . Hctz [Hydrochlorothiazide]     ED    Medical History:  Past Medical  History:  Diagnosis Date  . Arthritis    hips now resolved s/p surgery; still has it in lower back  . B12 deficiency   . Cat allergies   . Colon polyp   . DDD (degenerative disc disease), lumbar   . ED (erectile dysfunction)   . Environmental allergies   . Hypertension 2000    Surgical History:  Past Surgical History:  Procedure Laterality Date  . JOINT REPLACEMENT Bilateral 2002/2009  . 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 Colon cancer Neg Hx   . Cancer Mother     "lymph node"  . Diabetes Mother     Social History:   Social History  Substance Use Topics  . Smoking status: Current Some Day Smoker    Types: Cigars  . Smokeless tobacco: Never Used  . Alcohol use 6.0 oz/week    10 Standard drinks or equivalent per week     Comment: daily    Review of Systems:  Review of Systems  Constitutional: Negative for chills, fever and malaise/fatigue.  HENT: Negative for congestion, ear pain and sore throat.   Eyes: Negative.   Respiratory: Negative for cough, shortness of breath and wheezing.   Cardiovascular: Negative for chest pain, palpitations and leg swelling.  Gastrointestinal: Negative for abdominal pain, blood in stool, constipation, diarrhea, heartburn and melena.  Genitourinary: Negative.   Skin: Negative.   Neurological: Negative for dizziness, sensory change, loss of consciousness and headaches.  Psychiatric/Behavioral: Negative for depression. The patient is not nervous/anxious and does not have insomnia.     Physical Exam: Estimated body mass index is 28.56 kg/m as calculated from the following:   Height as of this encounter: 6' 0.75" (1.848 m).   Weight as of this encounter: 215 lb (97.5 kg). BP 136/84   Pulse 76   Temp 98.2 F (36.8 C) (Temporal)   Resp 18   Ht 6' 0.75" (1.848 m)   Wt 215 lb (97.5 kg)   BMI 28.56 kg/m   Wt Readings  from Last 3 Encounters:  05/21/16 215 lb (97.5 kg)  05/22/15 216 lb (98 kg)  05/10/14 215 lb (97.5 kg)   General Appearance: Well nourished, in no apparent distress.  Eyes: PERRLA, EOMs, conjunctiva no swelling or erythema ENT/Mouth: Ear canals clear bilaterally with no erythema, swelling, discharge.  TMs normal bilaterally with no erythema, bulging, or retractions.  Oropharynx clear and moist with no exudate, swelling, or erythema.  Dentition normal.   Neck: Supple, thyroid normal. No bruits, JVD, cervical adenopathy Respiratory: Respiratory effort normal, BS equal bilaterally without rales, rhonchi, wheezing or stridor.  Cardio: RRR without murmurs, rubs or gallops. Brisk peripheral pulses without edema.  Chest: symmetric, with normal excursions Abdomen: Soft, nontender, no guarding, rebound, hernias, masses, or organomegaly. Genitourinary: No external hemorrhoids.  Rectal tone normal. Prostate smooth and non-tender without palpable masses.   Musculoskeletal: Full ROM all peripheral extremities,5/5 strength, and normal gait.  Skin: Warm, dry without rashes, lesions, ecchymosis. Neuro: A&Ox3, Cranial nerves intact, reflexes equal bilaterally. Normal muscle tone, no cerebellar symptoms. Sensation intact.  Psych: Normal affect, Insight and Judgment appropriate.   EKG: WNL no changes.  Over 40 minutes of exam, counseling, chart review and critical decision making was performed  Loma Sousa Forcucci 2:18 PM [facility]

## 2016-05-22 LAB — BASIC METABOLIC PANEL WITH GFR
BUN: 16 mg/dL (ref 7–25)
CALCIUM: 9.7 mg/dL (ref 8.6–10.3)
CHLORIDE: 102 mmol/L (ref 98–110)
CO2: 19 mmol/L — ABNORMAL LOW (ref 20–31)
CREATININE: 0.91 mg/dL (ref 0.70–1.33)
GFR, Est African American: >89 mL/min (ref >=60)
Glucose, Bld: 107 mg/dL — ABNORMAL HIGH (ref 65–99)
Potassium: 4 mmol/L (ref 3.5–5.3)
SODIUM: 138 mmol/L (ref 135–146)

## 2016-05-22 LAB — MAGNESIUM: MAGNESIUM: 2 mg/dL (ref 1.5–2.5)

## 2016-05-22 LAB — VITAMIN B12: VITAMIN B 12: 204 pg/mL (ref 200–1100)

## 2016-05-22 LAB — HEPATIC FUNCTION PANEL
ALT: 15 U/L (ref 9–46)
AST: 19 U/L (ref 10–35)
Albumin: 4.6 g/dL (ref 3.6–5.1)
Alkaline Phosphatase: 54 U/L (ref 40–115)
BILIRUBIN DIRECT: 0.2 mg/dL (ref <=0.2)
BILIRUBIN INDIRECT: 0.6 mg/dL (ref 0.2–1.2)
BILIRUBIN TOTAL: 0.8 mg/dL (ref 0.2–1.2)
Total Protein: 7.4 g/dL (ref 6.1–8.1)

## 2016-05-22 LAB — PSA: PSA: 0.7 ng/mL (ref <=4.0)

## 2016-05-22 LAB — URINALYSIS, ROUTINE W REFLEX MICROSCOPIC
Bilirubin Urine: NEGATIVE
GLUCOSE, UA: NEGATIVE
HGB URINE DIPSTICK: NEGATIVE
KETONES UR: NEGATIVE
Leukocytes, UA: NEGATIVE
Nitrite: NEGATIVE
PH: 6.5 (ref 5.0–8.0)
Protein, ur: NEGATIVE
SPECIFIC GRAVITY, URINE: 1.008 (ref 1.001–1.035)

## 2016-05-22 LAB — IRON AND TIBC
%SAT: 42 % (ref 15–60)
IRON: 141 ug/dL (ref 50–180)
TIBC: 337 ug/dL (ref 250–425)
UIBC: 196 ug/dL (ref 125–400)

## 2016-05-22 LAB — MICROALBUMIN / CREATININE URINE RATIO
Creatinine, Urine: 43 mg/dL (ref 20–370)
Microalb Creat Ratio: 7 mcg/mg creat (ref <30)
Microalb, Ur: 0.3 mg/dL

## 2016-05-22 LAB — LIPID PANEL
CHOL/HDL RATIO: 2.7 ratio (ref <5.0)
CHOLESTEROL: 165 mg/dL (ref <200)
HDL: 62 mg/dL (ref >40)
LDL Cholesterol: 88 mg/dL (ref <100)
Triglycerides: 75 mg/dL (ref <150)
VLDL: 15 mg/dL (ref <30)

## 2016-05-22 LAB — TESTOSTERONE: Testosterone: 382 ng/dL (ref 250–827)

## 2016-05-22 LAB — VITAMIN D 25 HYDROXY (VIT D DEFICIENCY, FRACTURES): Vit D, 25-Hydroxy: 38 ng/mL (ref 30–100)

## 2016-05-22 LAB — HEMOGLOBIN A1C
Hgb A1c MFr Bld: 5 % (ref <5.7)
Mean Plasma Glucose: 97 mg/dL

## 2016-05-22 LAB — INSULIN, RANDOM: Insulin: 6.5 u[IU]/mL (ref 2.0–19.6)

## 2016-06-17 ENCOUNTER — Encounter: Payer: Self-pay | Admitting: Internal Medicine

## 2016-06-17 ENCOUNTER — Ambulatory Visit (INDEPENDENT_AMBULATORY_CARE_PROVIDER_SITE_OTHER): Payer: 59 | Admitting: Internal Medicine

## 2016-06-17 VITALS — BP 128/84 | HR 88 | Temp 98.2°F | Resp 96 | Ht 72.75 in

## 2016-06-17 DIAGNOSIS — J069 Acute upper respiratory infection, unspecified: Secondary | ICD-10-CM | POA: Diagnosis not present

## 2016-06-17 MED ORDER — PROMETHAZINE-DM 6.25-15 MG/5ML PO SYRP: ORAL_SOLUTION | ORAL | 1 refills | Status: DC

## 2016-06-17 MED ORDER — BENZONATATE 200 MG PO CAPS: 200.0000 mg | ORAL_CAPSULE | Freq: Three times a day (TID) | ORAL | 0 refills | Status: DC | PRN

## 2016-06-17 MED ORDER — PREDNISONE 20 MG PO TABS: ORAL_TABLET | ORAL | 0 refills | Status: DC

## 2016-06-17 MED ORDER — AZITHROMYCIN 250 MG PO TABS: ORAL_TABLET | ORAL | 0 refills | Status: DC

## 2016-06-17 NOTE — Progress Notes (Signed)
HPI  Patient presents to the office for evaluation of cough.  It has been going on for 2 weeks.  Patient reports night > day, wet, worse with lying down.  They also endorse postnasal drip and nasal congestion, rhinorrhea, yellow or clear nasal drainage, bilateral ear pressure and popping,.  .  They have tried mucinex, netti pots, delsym.  They report that nothing has worked.  They admits to other sick contacts.  Both his girlfired and his girlfriends daughter have similar symptoms.   Review of Systems  Constitutional: Positive for malaise/fatigue. Negative for chills and fever.  HENT: Positive for congestion, ear pain, hearing loss and sore throat.   Respiratory: Positive for cough. Negative for sputum production, shortness of breath and wheezing.   Cardiovascular: Negative for chest pain, palpitations and leg swelling.  Neurological: Positive for headaches.    PE:  Vitals:   06/17/16 1114  BP: 128/84  Pulse: 88  Resp: (!) 96  Temp: 98.2 F (36.8 C)    General:  Alert and non-toxic, WDWN, NAD HEENT: NCAT, PERLA, EOM normal, no occular discharge or erythema.  Nasal mucosal edema with sinus tenderness to palpation.  Oropharynx clear with minimal oropharyngeal edema and erythema.  Mucous membranes moist and pink. Neck:  Cervical adenopathy Chest:  RRR no MRGs.  Lungs clear to auscultation A&P with no wheezes rhonchi or rales.   Abdomen: +BS x 4 quadrants, soft, non-tender, no guarding, rigidity, or rebound. Skin: warm and dry no rash Neuro: A&Ox4, CN II-XII grossly intact  Assessment and Plan:   1. Acute URI -cont zyrtec D -flonase -netti pot -prednisone -phenergan dm -tessalon -zpak -likely viral -patient aware to go to ER if worsening SOB or let us know during office hours

## 2016-07-20 ENCOUNTER — Encounter: Payer: Self-pay | Admitting: Gastroenterology

## 2016-08-13 ENCOUNTER — Ambulatory Visit (INDEPENDENT_AMBULATORY_CARE_PROVIDER_SITE_OTHER): Payer: 59 | Admitting: Internal Medicine

## 2016-08-13 ENCOUNTER — Encounter: Payer: Self-pay | Admitting: Internal Medicine

## 2016-08-13 VITALS — BP 124/80 | HR 92 | Temp 98.4°F | Resp 16 | Ht 72.75 in | Wt 218.0 lb

## 2016-08-13 DIAGNOSIS — H6121 Impacted cerumen, right ear: Secondary | ICD-10-CM | POA: Diagnosis not present

## 2016-08-13 MED ORDER — FLUTICASONE PROPIONATE 0.05 % EX CREA: TOPICAL_CREAM | Freq: Two times a day (BID) | CUTANEOUS | 2 refills | Status: DC | PRN

## 2016-08-13 NOTE — Patient Instructions (Signed)
Earwax Buildup Your ears make a substance called earwax. It may also be called cerumen. Sometimes, too much earwax builds up in your ear canal. This can cause ear pain and make it harder for you to hear. CAUSES This condition is caused by too much earwax production or buildup. RISK FACTORS The following factors may make you more likely to develop this condition:  Cleaning your ears often with swabs.  Having narrow ear canals.  Having earwax that is overly thick or sticky.  Having eczema.  Being dehydrated. SYMPTOMS Symptoms of this condition include:  Reduced hearing.  Ear drainage.  Ear pain.  Ear itch.  A feeling of fullness in the ear or feeling that the ear is plugged.  Ringing in the ear.  Coughing. DIAGNOSIS Your health care provider can diagnose this condition based on your symptoms and medical history. Your health care provider will also do an ear exam to look inside your ear with a scope (otoscope). You may also have a hearing test. TREATMENT Treatment for this condition includes:  Over-the-counter or prescription ear drops to soften the earwax.  Earwax removal by a health care provider. This may be done:  By flushing the ear with body-temperature water.  With a medical instrument that has a loop at the end (earwax curette).  With a suction device. HOME CARE INSTRUCTIONS  Take over-the-counter and prescription medicines only as told by your health care provider.  Do not put any objects, including an ear swab, into your ear. You can clean the opening of your ear canal with a washcloth.  Drink enough water to keep your urine clear or pale yellow.  If you have frequent earwax buildup or you use hearing aids, consider seeing your health care provider every 6-12 months for routine preventive ear cleanings. Keep all follow-up visits as told by your health care provider. SEEK MEDICAL CARE IF:  You have ear pain.  Your condition does not improve with  treatment.  You have hearing loss.  You have blood, pus, or other fluid coming from your ear. This information is not intended to replace advice given to you by your health care provider. Make sure you discuss any questions you have with your health care provider. Document Released: 07/30/2004 Document Revised: 10/14/2015 Document Reviewed: 02/06/2015 Elsevier Interactive Patient Education  2017 [employer]  

## 2016-08-13 NOTE — Progress Notes (Signed)
HPI  Patient presents to the office for evaluation of right ear pain.  It has been going on for 1 months.  Patient reports no cough.  They also endorse right ear pain with cracking and fullness.  His hearing has not been having trouble with his hearing.  .  They have tried none.  They report that nothing has worked.  They denies other sick contacts.  Review of Systems  Constitutional: Negative for chills, fever and malaise/fatigue.  HENT: Positive for ear pain. Negative for congestion, ear discharge, hearing loss, sinus pain, sore throat and tinnitus.   Eyes: Negative.   Respiratory: Negative for cough, shortness of breath, wheezing and stridor.   Cardiovascular: Negative for chest pain, palpitations and leg swelling.    PE:  Vitals:   08/13/16 1437  BP: 124/80  Pulse: 92  Resp: 16  Temp: 98.4 F (36.9 C)    General:  Alert and non-toxic, WDWN, NAD HEENT: NCAT, PERLA, EOM normal, no occular discharge or erythema.  Nasal mucosal edema with sinus tenderness to palpation.  Oropharynx clear with minimal oropharyngeal edema and erythema.  Mucous membranes moist and pink.  Right ear impacted with cerumen Neck:  Cervical adenopathy Chest:  RRR no MRGs.  Lungs clear to auscultation A&P with no wheezes rhonchi or rales.   Abdomen: +BS x 4 quadrants, soft, non-tender, no guarding, rigidity, or rebound. Skin: warm and dry no rash Neuro: A&Ox4, CN II-XII grossly intact  Assessment and Plan:   1. Impacted cerumen of right ear -ear lavage performed -right ear drum without significant effusion -if continued pain can use benadryl at bedtime and can send in astelin spray

## 2016-11-18 ENCOUNTER — Encounter: Payer: Self-pay | Admitting: *Deleted

## 2016-12-02 ENCOUNTER — Encounter: Payer: Self-pay | Admitting: *Deleted

## 2017-02-02 ENCOUNTER — Ambulatory Visit (INDEPENDENT_AMBULATORY_CARE_PROVIDER_SITE_OTHER): Payer: 59 | Admitting: Internal Medicine

## 2017-02-02 ENCOUNTER — Encounter: Payer: Self-pay | Admitting: Internal Medicine

## 2017-02-02 VITALS — BP 138/94 | HR 84 | Temp 97.5°F | Resp 20 | Ht 72.75 in | Wt 217.8 lb

## 2017-02-02 DIAGNOSIS — J0410 Acute tracheitis without obstruction: Secondary | ICD-10-CM | POA: Diagnosis not present

## 2017-02-02 DIAGNOSIS — J0140 Acute pansinusitis, unspecified: Secondary | ICD-10-CM

## 2017-02-02 MED ORDER — AZITHROMYCIN 250 MG PO TABS: ORAL_TABLET | ORAL | 1 refills | Status: DC

## 2017-02-02 MED ORDER — PREDNISONE 20 MG PO TABS: ORAL_TABLET | ORAL | 0 refills | Status: DC

## 2017-02-02 MED ORDER — MONTELUKAST SODIUM 10 MG PO TABS: ORAL_TABLET | ORAL | 3 refills | Status: DC

## 2017-02-02 NOTE — Progress Notes (Signed)
  Subjective:    Patient ID: Walter Black, male    DOB: 01/21/1961, 56 y.o.   MRN: [id number]  HPI  This very nice single WM with mild labile HTN presents with c/o 2 week prodrome of worsening post nasal drainage and non-productive cough & hoarseness. He denies fever, chills, sweats, rash or dyspnea.   Medication Sig  . celecoxib (CELEBREX) 200 MG capsule Take 200 mg by mouth daily as needed.  . fluticasone (CUTIVATE) 0.05 % cream Apply topically 2 (two) times daily as needed.  . fluticasone (FLONASE) 50 MCG/ACT nasal spray 1 spray both nostrils in a.m.  Marland Kitchen ipratropium (ATROVENT) 0.06 % nasal spray Place 2 sprays into the nose 3 (three) times daily.  . traMADol (ULTRAM) 50 MG tablet Take 50 mg by mouth every 6 (six) hours as needed.  . cetirizine-pseudoephedrine (ZYRTEC-D) 5-120 MG tablet Take 1 tablet by mouth 2 (two) times daily.    Allergies  Allergen Reactions  . Hctz [Hydrochlorothiazide]     ED   Past Medical History:  Diagnosis Date  . Arthritis    hips now resolved s/p surgery; still has it in lower back  . B12 deficiency   . Cat allergies   . Colon polyp   . DDD (degenerative disc disease), lumbar   . ED (erectile dysfunction)   . Environmental allergies   . Hypertension 2000   Review of Systems  10 point systems review negative except as above.    Objective:   Physical Exam  BP (!) 138/94   Pulse 84   Temp (!) 97.5 F (36.4 C)   Resp 20   Ht 6' 0.75" (1.848 m)   Wt 217 lb 12.8 oz (98.8 kg)   BMI 28.93 kg/m   Sl hoarse w/raspy voice. Dry raspy cough . No stridor.   HEENT - Eac's patent. TM's Nl. EOM's full. PERRLA. NasoOroPharynx with putrid greenish yellow posterior pharyngeal exudate. No sinus tenderness.  Neck - supple. Nl Thyroid. Carotids 2+ & No bruits, nodes, JVD Chest - Equal BS w/ few scattered fine rales and forced post tussive terminal wheezes.   Cor - Nl HS. RRR w/o sig MGR.   MS- FROM. Gait Nl. Neuro -  Nl w/o focal abnormalities. Skin -  clear w/o cyanosis, rash.     Assessment & Plan:   1. Acute pansinusitis   2. Tracheitis  - azithromycin (ZITHROMAX) 250 MG tablet; Take 2 tablets (500 mg) on  Day 1,  followed by 1 tablet (250 mg) once daily on Days 2 through 5.  Dispense: 6 each; Refill: 1  - predniSONE (DELTASONE) 20 MG tablet; 1 tab 3 x day for 3 days, then 1 tab 2 x day for 3 days, then 1 tab 1 x day for 5 days  Dispense: 20 tablet; Refill: 0  - montelukast (SINGULAIR) 10 MG tablet; Take 1 tablet daily for Allergies  Dispense: 90 tablet; Refill: 3  - discussed meds & SE's.   - ROV prn

## 2017-05-18 ENCOUNTER — Encounter: Payer: Self-pay | Admitting: Internal Medicine

## 2017-05-19 NOTE — Progress Notes (Signed)
Complete Physical  Assessment and Plan:  Addison was seen today for annual exam.  Diagnoses and all orders for this visit:  Encounter for general adult medical examination with abnormal findings  Essential hypertension Previously well controlled by lifestyle; elevated in office today - patient has been taking pseudoephedrine for congestion and had just had 2 large cups of coffee this AM - will follow up in 1 week for repeat BP check and restart medication as indicated. -     Magnesium  Irritable bowel syndrome with diarrhea       -     Takes citrucel daily; symptoms stable  - with occasional loose stool   Personal history of colonic polyps -      Overdue for 5 year follow up colonoscopy; patient will reach out to schedule.   DDD (degenerative disc disease), lumbar       -     Had back injections, has been stable for past 4 years after weight loss and with core strengthening  Erectile dysfunction, unspecified erectile dysfunction type        -    Sildenafil via mail order - working well   Environmental allergies       -      Avoid triggers; well managed with current PRN medications  B12 deficiency -     Vitamin B12  Status post total replacement of both hips       -     Follow up with Dr. Wynelle Link as needed, no issues  Screening for thyroid disorder -     TSH  Screening for hyperlipidemia -     Lipid panel  Screening for diabetes mellitus -     Hemoglobin A1c -     Insulin, random  Screening for cardiovascular condition -     EKG 12-Lead  Screening for prostate cancer -     PSA  Screening for hematuria or proteinuria -     Urinalysis, Complete (81001)  Medication management -     CBC with Differential/Platelet -     BASIC METABOLIC PANEL WITH GFR -     Hepatic function panel  Vitamin D deficiency -     VITAMIN D 25 Hydroxy (Vit-D Deficiency, Fractures)  Discussed med's effects and SE's. Screening labs and tests as requested with regular follow-up as  recommended. 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 His medical problems includes:  has IBS (irritable bowel syndrome) - mild w diarrhea; Hypertension; Environmental allergies; DDD (degenerative disc disease), lumbar; ED (erectile dysfunction); B12 deficiency; Personal history of colonic polyps; and S/P total hip arthroplasty on their problem list. Followed by Dr. Allyson Sabal (derm) - for rash/skin concerns and routine check ups. 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    BMI is Body mass index is 28.96 kg/m., he has been working on diet and exercise. Exercises 5 days a week.  Wt Readings from Last 3 Encounters:  05/20/17 215 lb (97.5 kg)  02/02/17 217 lb 12.8 oz (98.8 kg)  08/13/16 218 lb (98.9 kg)   Today their BP is BP: Marland Kitchen)  150/94 He does workout. He denies chest pain, shortness of breath, dizziness.   He is not on cholesterol medication and denies myalgias. His cholesterol is at goal. The cholesterol last visit was:   Lab Results  Component Value Date   CHOL 165 05/21/2016   HDL 62 05/21/2016   LDLCALC 88 05/21/2016   TRIG 75 05/21/2016   CHOLHDL 2.7 05/21/2016    Last A1C in the office was:  Lab Results  Component Value Date   HGBA1C 5.0 05/21/2016   Last GFR: Lab Results  Component Value Date   GFRNONAA >89 05/21/2016   Patient is not on Vitamin D supplement and low at last visit:   Lab Results  Component Value Date   VD25OH 38 05/21/2016     Last PSA was: Lab Results  Component Value Date   PSA 0.7 05/21/2016    Current  Medications:  Current Outpatient Medications on File Prior to Visit  Medication Sig Dispense Refill  . cetirizine-pseudoephedrine (ZYRTEC-D) 5-120 MG tablet Take 1 tablet 2 (two) times daily by mouth.    . fluticasone (CUTIVATE) 0.05 % cream Apply topically 2 (two) times daily as needed. 60 g 2  . fluticasone (FLONASE) 50 MCG/ACT nasal spray 1 spray both nostrils in a.m. 16 g 3  . ipratropium (ATROVENT) 0.06 % nasal spray Place 2 sprays into the nose 3 (three) times daily. 45 mL 3  . sildenafil (VIAGRA) 50 MG tablet Take 50 mg daily as needed by mouth for erectile dysfunction.     No current facility-administered medications on file prior to visit.    Allergies:  Allergies  Allergen Reactions  . Hctz [Hydrochlorothiazide]     ED   Health Maintenance:  Immunization History  Administered Date(s) Administered  . Influenza Inj Mdck Quad With Preservative 05/20/2017  . Influenza Split 05/10/2014  . Influenza, Seasonal, Injecte, Preservative Fre 05/22/2015  . Influenza,inj,quad, With Preservative 05/21/2016  . Influenza-Unspecified 05/06/2011, 04/18/2013  . PPD Test 05/10/2014  . Tdap 07/29/2011    Tetanus: 2013 Flu vaccine: 2018 Shingrix: declines  Colonoscopy: 2013 - polyp biopsied - due last year, patient will call to set up , call office if need new referral  EGD: n/a Eye Exam: several years ago - encouraged to follow up with this Dentist: Dr. Gloriann Loan - q6 months - no issues  Patient Care Team: Unk Pinto, MD as PCP - General (Internal Medicine) Milus Banister, MD as Consulting Physician (Gastroenterology) Michael Boston, MD as Consulting Physician (General Surgery) Monna Fam, MD as Consulting Physician (Ophthalmology) Kathie Rhodes, MD as Consulting Physician (Urology) Suella Broad, MD as Consulting Physician (Physical Medicine and Rehabilitation) Druscilla Brownie, MD as Consulting Physician (Dermatology) Mosetta Anis, MD as Referring Physician  (Allergy)  Medical History:  has IBS (irritable bowel syndrome) - mild w diarrhea; Hypertension; Environmental allergies; DDD (degenerative disc disease), lumbar; ED (erectile dysfunction); B12 deficiency; Personal history of colonic polyps; and S/P total hip arthroplasty on their problem list. Surgical History:  He  has a past surgical history that includes Replacement total hip w/  resurfacing implants (2002 left; 2009 right) and Joint replacement (Bilateral, 2002/2009). Family History:  His family history includes Cancer in his mother; Diabetes in his mother. Social History:   reports that he has been smoking cigars.  he has never used smokeless tobacco. He reports that he drinks about 6.0 oz of alcohol per week. He reports that he does not use drugs. Review of Systems:  Review of Systems  Constitutional: Negative for  chills, fever, malaise/fatigue and weight loss.  HENT: Positive for congestion. Negative for hearing loss, sinus pain, sore throat and tinnitus.   Eyes: Negative for blurred vision and double vision.  Respiratory: Negative for cough, sputum production, shortness of breath and wheezing.   Cardiovascular: Negative for chest pain, palpitations, orthopnea, claudication and leg swelling.  Gastrointestinal: Negative for abdominal pain, blood in stool, constipation, diarrhea, heartburn, melena, nausea and vomiting.  Genitourinary: Negative.  Negative for dysuria, frequency, hematuria and urgency.  Musculoskeletal: Negative for back pain, joint pain and myalgias.  Skin: Positive for rash (Intermittent rash in axilla/on chest after exercise. Sees Dr. Allyson Sabal).  Neurological: Negative for dizziness, tingling, sensory change, weakness and headaches.  Endo/Heme/Allergies: Positive for environmental allergies. Negative for polydipsia.  Psychiatric/Behavioral: Negative.  Negative for depression and substance abuse. The patient is not nervous/anxious and does not have insomnia.   All other  systems reviewed and are negative.   Physical Exam: Estimated body mass index is 28.96 kg/m as calculated from the following:   Height as of this encounter: 6' 0.25" (1.835 m).   Weight as of this encounter: 215 lb (97.5 kg). BP (!) 150/94   Pulse 73   Temp (!) 97.5 F (36.4 C)   Ht 6' 0.25" (1.835 m)   Wt 215 lb (97.5 kg)   SpO2 96%   BMI 28.96 kg/m  General Appearance: Well nourished, in no apparent distress.  Eyes: PERRLA, EOMs, conjunctiva no swelling or erythema, normal fundi and vessels.  Sinuses: No Frontal/maxillary tenderness  ENT/Mouth: Ext aud canals clear, normal light reflex with TMs without erythema, bulging. Good dentition. No erythema, swelling, or exudate on post pharynx. Tonsils not swollen or erythematous. Hearing normal.  Neck: Supple, thyroid normal. No bruits  Respiratory: Respiratory effort normal, BS equal bilaterally without rales, rhonchi, wheezing or stridor.  Cardio: RRR without murmurs, rubs or gallops. Brisk peripheral pulses without edema.  Chest: symmetric, with normal excursions and percussion.  Abdomen: Soft, nontender, no guarding, rebound, hernias, masses, or organomegaly.  Lymphatics: Non tender without lymphadenopathy.  Genitourinary: Defer Musculoskeletal: Full ROM all peripheral extremities,5/5 strength, and normal gait.  Skin: Warm, dry without rashes, lesions, ecchymosis. Neuro: Cranial nerves intact, reflexes equal bilaterally. Normal muscle tone, no cerebellar symptoms. Sensation intact.  Psych: Awake and oriented X 3, normal affect, Insight and Judgment appropriate.   EKG: WNL no changes.  Gorden Harms Sheronda Parran 1:47 PM [facility]

## 2017-05-20 ENCOUNTER — Encounter: Payer: Self-pay | Admitting: Adult Health

## 2017-05-20 ENCOUNTER — Ambulatory Visit: Payer: 59 | Admitting: Adult Health

## 2017-05-20 VITALS — BP 150/94 | HR 73 | Temp 97.5°F | Ht 72.25 in | Wt 215.0 lb

## 2017-05-20 DIAGNOSIS — Z96643 Presence of artificial hip joint, bilateral: Secondary | ICD-10-CM

## 2017-05-20 DIAGNOSIS — Z9109 Other allergy status, other than to drugs and biological substances: Secondary | ICD-10-CM

## 2017-05-20 DIAGNOSIS — Z0000 Encounter for general adult medical examination without abnormal findings: Secondary | ICD-10-CM

## 2017-05-20 DIAGNOSIS — Z136 Encounter for screening for cardiovascular disorders: Secondary | ICD-10-CM | POA: Diagnosis not present

## 2017-05-20 DIAGNOSIS — Z114 Encounter for screening for human immunodeficiency virus [HIV]: Secondary | ICD-10-CM

## 2017-05-20 DIAGNOSIS — Z13220 Encounter for screening for lipoid disorders: Secondary | ICD-10-CM

## 2017-05-20 DIAGNOSIS — I10 Essential (primary) hypertension: Secondary | ICD-10-CM

## 2017-05-20 DIAGNOSIS — Z23 Encounter for immunization: Secondary | ICD-10-CM

## 2017-05-20 DIAGNOSIS — K580 Irritable bowel syndrome with diarrhea: Secondary | ICD-10-CM

## 2017-05-20 DIAGNOSIS — N529 Male erectile dysfunction, unspecified: Secondary | ICD-10-CM

## 2017-05-20 DIAGNOSIS — Z1329 Encounter for screening for other suspected endocrine disorder: Secondary | ICD-10-CM

## 2017-05-20 DIAGNOSIS — Z79899 Other long term (current) drug therapy: Secondary | ICD-10-CM

## 2017-05-20 DIAGNOSIS — Z125 Encounter for screening for malignant neoplasm of prostate: Secondary | ICD-10-CM

## 2017-05-20 DIAGNOSIS — Z1389 Encounter for screening for other disorder: Secondary | ICD-10-CM

## 2017-05-20 DIAGNOSIS — Z131 Encounter for screening for diabetes mellitus: Secondary | ICD-10-CM

## 2017-05-20 DIAGNOSIS — Z1159 Encounter for screening for other viral diseases: Secondary | ICD-10-CM

## 2017-05-20 DIAGNOSIS — E538 Deficiency of other specified B group vitamins: Secondary | ICD-10-CM

## 2017-05-20 DIAGNOSIS — Z0001 Encounter for general adult medical examination with abnormal findings: Secondary | ICD-10-CM

## 2017-05-20 DIAGNOSIS — E559 Vitamin D deficiency, unspecified: Secondary | ICD-10-CM

## 2017-05-20 DIAGNOSIS — Z86010 Personal history of colonic polyps: Secondary | ICD-10-CM

## 2017-05-20 DIAGNOSIS — M5136 Other intervertebral disc degeneration, lumbar region: Secondary | ICD-10-CM

## 2017-05-21 LAB — HEPATITIS C ANTIBODY
Hepatitis C Ab: NONREACTIVE
SIGNAL TO CUT-OFF: 0.02 (ref <1.00)

## 2017-05-21 LAB — URINALYSIS, COMPLETE
BILIRUBIN URINE: NEGATIVE
Bacteria, UA: NONE SEEN /HPF
GLUCOSE, UA: NEGATIVE
HGB URINE DIPSTICK: NEGATIVE
HYALINE CAST: NONE SEEN /LPF
Ketones, ur: NEGATIVE
Leukocytes, UA: NEGATIVE
NITRITE: NEGATIVE
PH: 6 (ref 5.0–8.0)
Protein, ur: NEGATIVE
RBC / HPF: NONE SEEN /HPF (ref 0–2)
Specific Gravity, Urine: 1.015 (ref 1.001–1.03)
Squamous Epithelial / LPF: NONE SEEN /HPF (ref <=5)
WBC UA: NONE SEEN /HPF (ref 0–5)

## 2017-05-21 LAB — HEPATIC FUNCTION PANEL
AG RATIO: 1.7 (calc) (ref 1.0–2.5)
ALKALINE PHOSPHATASE (APISO): 53 U/L (ref 40–115)
ALT: 16 U/L (ref 9–46)
AST: 17 U/L (ref 10–35)
Albumin: 4.5 g/dL (ref 3.6–5.1)
BILIRUBIN INDIRECT: 0.4 mg/dL (ref 0.2–1.2)
Bilirubin, Direct: 0.1 mg/dL (ref 0.0–0.2)
Globulin: 2.6 g/dL (calc) (ref 1.9–3.7)
TOTAL PROTEIN: 7.1 g/dL (ref 6.1–8.1)
Total Bilirubin: 0.5 mg/dL (ref 0.2–1.2)

## 2017-05-21 LAB — CBC WITH DIFFERENTIAL/PLATELET
BASOS PCT: 0.8 %
Basophils Absolute: 52 cells/uL (ref 0–200)
EOS PCT: 4.3 %
Eosinophils Absolute: 280 cells/uL (ref 15–500)
HCT: 46 % (ref 38.5–50.0)
HEMOGLOBIN: 16.1 g/dL (ref 13.2–17.1)
Lymphs Abs: 2028 cells/uL (ref 850–3900)
MCH: 32.7 pg (ref 27.0–33.0)
MCHC: 35 g/dL (ref 32.0–36.0)
MCV: 93.5 fL (ref 80.0–100.0)
MONOS PCT: 10.6 %
MPV: 9.7 fL (ref 7.5–12.5)
NEUTROS ABS: 3452 {cells}/uL (ref 1500–7800)
Neutrophils Relative %: 53.1 %
PLATELETS: 253 10*3/uL (ref 140–400)
RBC: 4.92 10*6/uL (ref 4.20–5.80)
RDW: 11.1 % (ref 11.0–15.0)
TOTAL LYMPHOCYTE: 31.2 %
WBC mixed population: 689 cells/uL (ref 200–950)
WBC: 6.5 10*3/uL (ref 3.8–10.8)

## 2017-05-21 LAB — BASIC METABOLIC PANEL WITH GFR
BUN: 12 mg/dL (ref 7–25)
CHLORIDE: 104 mmol/L (ref 98–110)
CO2: 27 mmol/L (ref 20–32)
CREATININE: 0.85 mg/dL (ref 0.70–1.33)
Calcium: 9.5 mg/dL (ref 8.6–10.3)
GFR, Est African American: 114 mL/min/{1.73_m2} (ref >=60)
GFR, Est Non African American: 98 mL/min/{1.73_m2} (ref >=60)
GLUCOSE: 102 mg/dL — AB (ref 65–99)
Potassium: 4.2 mmol/L (ref 3.5–5.3)
SODIUM: 139 mmol/L (ref 135–146)

## 2017-05-21 LAB — PSA: PSA: 0.8 ng/mL (ref <=4.0)

## 2017-05-21 LAB — VITAMIN D 25 HYDROXY (VIT D DEFICIENCY, FRACTURES): VIT D 25 HYDROXY: 38 ng/mL (ref 30–100)

## 2017-05-21 LAB — INSULIN, RANDOM: Insulin: 7.4 u[IU]/mL (ref 2.0–19.6)

## 2017-05-21 LAB — LIPID PANEL
CHOL/HDL RATIO: 2.7 (calc) (ref <5.0)
CHOLESTEROL: 160 mg/dL (ref <200)
HDL: 59 mg/dL (ref >40)
LDL CHOLESTEROL (CALC): 81 mg/dL
NON-HDL CHOLESTEROL (CALC): 101 mg/dL (ref <130)
Triglycerides: 100 mg/dL (ref <150)

## 2017-05-21 LAB — MAGNESIUM: Magnesium: 2 mg/dL (ref 1.5–2.5)

## 2017-05-21 LAB — HEMOGLOBIN A1C
Hgb A1c MFr Bld: 5.1 % of total Hgb (ref <5.7)
MEAN PLASMA GLUCOSE: 100 (calc)
eAG (mmol/L): 5.5 (calc)

## 2017-05-21 LAB — VITAMIN B12: Vitamin B-12: 200 pg/mL (ref 200–1100)

## 2017-05-21 LAB — TSH: TSH: 1.96 mIU/L (ref 0.40–4.50)

## 2017-05-21 LAB — HIV ANTIBODY (ROUTINE TESTING W REFLEX): HIV 1&2 Ab, 4th Generation: NONREACTIVE

## 2017-05-25 ENCOUNTER — Other Ambulatory Visit: Payer: Self-pay | Admitting: Adult Health

## 2017-05-25 ENCOUNTER — Ambulatory Visit (INDEPENDENT_AMBULATORY_CARE_PROVIDER_SITE_OTHER): Payer: 59

## 2017-05-25 DIAGNOSIS — I10 Essential (primary) hypertension: Secondary | ICD-10-CM

## 2017-05-25 DIAGNOSIS — Z79899 Other long term (current) drug therapy: Secondary | ICD-10-CM

## 2017-05-25 MED ORDER — LOSARTAN POTASSIUM 50 MG PO TABS: 50.0000 mg | ORAL_TABLET | Freq: Every day | ORAL | 3 refills | Status: DC

## 2017-05-25 NOTE — Progress Notes (Signed)
Patient followed up today for BP recheck; 146/92 - will initiate losartan 50 mg daily and bring back for nurse visit - BP recheck and labs for BMP/GFR

## 2017-05-25 NOTE — Progress Notes (Signed)
Patient presents to the office for a BP check. BP taken in the left arm. Results were 146/92. Patient will follow up in 1 month for a recheck of BP and lab work to monitor his kidney function with the new medication.

## 2017-06-24 ENCOUNTER — Ambulatory Visit (INDEPENDENT_AMBULATORY_CARE_PROVIDER_SITE_OTHER): Payer: 59 | Admitting: *Deleted

## 2017-06-24 ENCOUNTER — Encounter: Payer: Self-pay | Admitting: *Deleted

## 2017-06-24 ENCOUNTER — Other Ambulatory Visit: Payer: Self-pay | Admitting: *Deleted

## 2017-06-24 DIAGNOSIS — I10 Essential (primary) hypertension: Secondary | ICD-10-CM

## 2017-06-24 DIAGNOSIS — Z79899 Other long term (current) drug therapy: Secondary | ICD-10-CM

## 2017-06-24 LAB — BASIC METABOLIC PANEL WITH GFR
BUN: 10 mg/dL (ref 7–25)
CALCIUM: 9.7 mg/dL (ref 8.6–10.3)
CHLORIDE: 105 mmol/L (ref 98–110)
CO2: 27 mmol/L (ref 20–32)
CREATININE: 0.79 mg/dL (ref 0.70–1.33)
GFR, EST AFRICAN AMERICAN: 116 mL/min/{1.73_m2} (ref >=60)
GFR, Est Non African American: 100 mL/min/{1.73_m2} (ref >=60)
Glucose, Bld: 122 mg/dL — ABNORMAL HIGH (ref 65–99)
Potassium: 4.1 mmol/L (ref 3.5–5.3)
Sodium: 140 mmol/L (ref 135–146)

## 2017-06-24 MED ORDER — FLUTICASONE PROPIONATE 50 MCG/ACT NA SUSP: NASAL | 3 refills | Status: DC

## 2017-06-24 MED ORDER — LOSARTAN POTASSIUM 50 MG PO TABS: 50.0000 mg | ORAL_TABLET | Freq: Every day | ORAL | 1 refills | Status: DC

## 2017-06-24 MED ORDER — MONTELUKAST SODIUM 10 MG PO TABS: 10.0000 mg | ORAL_TABLET | Freq: Every day | ORAL | 1 refills | Status: DC

## 2017-06-24 MED ORDER — IPRATROPIUM BROMIDE 0.06 % NA SOLN: 2.0000 | Freq: Three times a day (TID) | NASAL | 3 refills | Status: DC

## 2017-06-24 NOTE — Progress Notes (Signed)
Patient here for a NV to recheck his BP and a BMET.  The patient's BP was 130/28 and pulse was 82.  He states he just started the Losartan 50 mg 1 week ago. Liane Comber, NP aware of readings and suggest a follow up OV in 6 months.

## 2017-06-25 ENCOUNTER — Other Ambulatory Visit: Payer: Self-pay | Admitting: *Deleted

## 2017-06-25 DIAGNOSIS — I10 Essential (primary) hypertension: Secondary | ICD-10-CM

## 2017-06-25 MED ORDER — LOSARTAN POTASSIUM 50 MG PO TABS: 50.0000 mg | ORAL_TABLET | Freq: Every day | ORAL | 1 refills | Status: DC

## 2017-10-14 ENCOUNTER — Other Ambulatory Visit: Payer: Self-pay

## 2017-10-14 DIAGNOSIS — E538 Deficiency of other specified B group vitamins: Secondary | ICD-10-CM

## 2017-10-14 MED ORDER — CETIRIZINE-PSEUDOEPHEDRINE ER 5-120 MG PO TB12: 1.0000 | ORAL_TABLET | Freq: Two times a day (BID) | ORAL | 0 refills | Status: DC

## 2017-11-23 ENCOUNTER — Other Ambulatory Visit: Payer: Self-pay | Admitting: Internal Medicine

## 2017-11-23 DIAGNOSIS — I10 Essential (primary) hypertension: Secondary | ICD-10-CM

## 2017-12-28 DIAGNOSIS — E663 Overweight: Secondary | ICD-10-CM | POA: Insufficient documentation

## 2017-12-28 NOTE — Progress Notes (Signed)
FOLLOW UP  Assessment and Plan:   Hypertension Well controlled with current medications  Monitor blood pressure at home; patient to call if consistently greater than 130/80 Continue DASH diet.   Reminder to go to the ER if any CP, SOB, nausea, dizziness, severe HA, changes vision/speech, left arm numbness and tingling and jaw pain.  Overweight Long discussion about weight loss, diet, and exercise Recommended diet heavy in fruits and veggies and low in animal meats, cheeses, and dairy products, appropriate calorie intake Discussed ideal weight for height and initial weight goal (210 lb) Patient will work on watching portion sizes Will follow up in 6 months   Continue diet and meds as discussed. Further disposition pending results of labs. Discussed med's effects and SE's.   Over 15 minutes of exam, counseling, chart review, and critical decision making was performed.   Future Appointments  Date Time Provider [place]  05/23/2018 10:00 AM Liane Comber, NP [facility] None    ----------------------------------------------------------------------------------------------------------------------  HPI 57 y.o. male  presents for 6 month follow up on hypertension, weight.   BMI is Body mass index is 29.77 kg/m., he has not been working on diet, but works out 5 days a week.  Wt Readings from Last 3 Encounters:  12/30/17 221 lb (100.2 kg)  05/20/17 215 lb (97.5 kg)  02/02/17 217 lb 12.8 oz (98.8 kg)   His blood pressure has been controlled at home, today their BP is BP: 122/86  He does workout. He denies chest pain, shortness of breath, dizziness.   Current Medications:  Current Outpatient Medications on File Prior to Visit  Medication Sig  . cetirizine-pseudoephedrine (ZYRTEC-D) 5-120 MG tablet Take 1 tablet by mouth 2 (two) times daily.  . fluticasone (CUTIVATE) 0.05 % cream Apply topically 2 (two) times daily as needed.  . fluticasone (FLONASE) 50 MCG/ACT nasal  spray 1 spray both nostrils in a.m.  Marland Kitchen ipratropium (ATROVENT) 0.06 % nasal spray Place 2 sprays into the nose 3 (three) times daily.  Marland Kitchen losartan (COZAAR) 50 MG tablet Take 1 tablet (50 mg total) by mouth daily.  Marland Kitchen losartan (COZAAR) 50 MG tablet TAKE 1 TABLET BY MOUTH  DAILY  . montelukast (SINGULAIR) 10 MG tablet TAKE 1 TABLET BY MOUTH AT  BEDTIME  . sildenafil (VIAGRA) 50 MG tablet Take 50 mg daily as needed by mouth for erectile dysfunction.   No current facility-administered medications on file prior to visit.      Allergies:  Allergies  Allergen Reactions  . Hctz [Hydrochlorothiazide]     ED     Medical History:  Past Medical History:  Diagnosis Date  . Arthritis    hips now resolved s/p surgery; still has it in lower back  . B12 deficiency   . Cat allergies   . Colon polyp   . DDD (degenerative disc disease), lumbar   . ED (erectile dysfunction)   . Environmental allergies   . Hypertension 2000   Family history- Reviewed and unchanged Social history- Reviewed and unchanged   Review of Systems:  Review of Systems  Constitutional: Negative for malaise/fatigue and weight loss.  HENT: Negative for hearing loss and tinnitus.   Eyes: Negative for blurred vision and double vision.  Respiratory: Negative for cough, shortness of breath and wheezing.   Cardiovascular: Negative for chest pain, palpitations, orthopnea, claudication and leg swelling.  Gastrointestinal: Negative for abdominal pain, blood in stool, constipation, diarrhea, heartburn, melena, nausea and vomiting.  Genitourinary: Negative.   Musculoskeletal: Negative for joint pain  and myalgias.  Skin: Negative for rash.  Neurological: Negative for dizziness, tingling, sensory change, weakness and headaches.  Endo/Heme/Allergies: Negative for polydipsia.  Psychiatric/Behavioral: Negative.   All other systems reviewed and are negative.     Physical Exam: BP 122/86   Pulse 75   Temp 97.8 F (36.6 C)   Resp  14   Ht 6' 0.25" (1.835 m)   Wt 221 lb (100.2 kg)   SpO2 97%   BMI 29.77 kg/m  Wt Readings from Last 3 Encounters:  12/30/17 221 lb (100.2 kg)  05/20/17 215 lb (97.5 kg)  02/02/17 217 lb 12.8 oz (98.8 kg)   General Appearance: Well nourished, in no apparent distress. Eyes: PERRLA, EOMs, conjunctiva no swelling or erythema Sinuses: No Frontal/maxillary tenderness ENT/Mouth: Ext aud canals clear, TMs without erythema, bulging. No erythema, swelling, or exudate on post pharynx.  Tonsils not swollen or erythematous. Hearing normal.  Neck: Supple, thyroid normal.  Respiratory: Respiratory effort normal, BS equal bilaterally without rales, rhonchi, wheezing or stridor.  Cardio: RRR with no MRGs. Brisk peripheral pulses without edema.  Abdomen: Soft, + BS.  Non tender, no guarding, rebound, hernias, masses. Lymphatics: Non tender without lymphadenopathy.  Musculoskeletal: Full ROM, 5/5 strength, Normal gait Skin: Warm, dry without rashes, lesions, ecchymosis.  Neuro: Cranial nerves intact. No cerebellar symptoms.  Psych: Awake and oriented X 3, normal affect, Insight and Judgment appropriate.    Izora Ribas, NP 8:53 AM [facility]

## 2017-12-30 ENCOUNTER — Encounter: Payer: Self-pay | Admitting: Adult Health

## 2017-12-30 ENCOUNTER — Ambulatory Visit: Payer: 59 | Admitting: Adult Health

## 2017-12-30 VITALS — BP 122/86 | HR 75 | Temp 97.8°F | Resp 14 | Ht 72.25 in | Wt 221.0 lb

## 2017-12-30 DIAGNOSIS — E663 Overweight: Secondary | ICD-10-CM

## 2017-12-30 DIAGNOSIS — I10 Essential (primary) hypertension: Secondary | ICD-10-CM

## 2017-12-30 LAB — COMPLETE METABOLIC PANEL WITH GFR
AG Ratio: 1.7 (calc) (ref 1.0–2.5)
ALKALINE PHOSPHATASE (APISO): 53 U/L (ref 40–115)
ALT: 16 U/L (ref 9–46)
AST: 18 U/L (ref 10–35)
Albumin: 4.4 g/dL (ref 3.6–5.1)
BUN: 11 mg/dL (ref 7–25)
CALCIUM: 9.1 mg/dL (ref 8.6–10.3)
CO2: 26 mmol/L (ref 20–32)
CREATININE: 0.82 mg/dL (ref 0.70–1.33)
Chloride: 103 mmol/L (ref 98–110)
GFR, Est African American: 115 mL/min/{1.73_m2} (ref >=60)
GFR, Est Non African American: 99 mL/min/{1.73_m2} (ref >=60)
GLOBULIN: 2.6 g/dL (ref 1.9–3.7)
Glucose, Bld: 107 mg/dL — ABNORMAL HIGH (ref 65–99)
Potassium: 4.1 mmol/L (ref 3.5–5.3)
SODIUM: 137 mmol/L (ref 135–146)
Total Bilirubin: 0.6 mg/dL (ref 0.2–1.2)
Total Protein: 7 g/dL (ref 6.1–8.1)

## 2017-12-30 MED ORDER — MONTELUKAST SODIUM 10 MG PO TABS: 10.0000 mg | ORAL_TABLET | Freq: Every day | ORAL | 0 refills | Status: DC

## 2017-12-30 MED ORDER — LOSARTAN POTASSIUM 50 MG PO TABS: 50.0000 mg | ORAL_TABLET | Freq: Every day | ORAL | 1 refills | Status: DC

## 2017-12-30 MED ORDER — FLUTICASONE PROPIONATE 0.05 % EX CREA: TOPICAL_CREAM | Freq: Two times a day (BID) | CUTANEOUS | 2 refills | Status: DC | PRN

## 2017-12-30 NOTE — Patient Instructions (Signed)
BP goal: <130/80  Weight goal for next visit: 210 lb   Aim for 7+ servings of fruits and vegetables daily  80+ fluid ounces of water or unsweet tea for healthy kidneys  Limit to 1-2 drinks of alcohol per occasion, avoid smoking  Limit animal fats in diet for cholesterol and heart health - choose grass fed whenever available  Aim for low stress - take time to unwind and care for your mental health  Aim for 150 min of moderate intensity exercise weekly for heart health, and weights twice weekly for bone health  Aim for 7-9 hours of sleep daily       When it comes to diets, agreement about the perfect plan isn't easy to find, even among the experts. Experts at the [employer] developed an idea known as the Healthy Eating Plate. Just imagine a plate divided into logical, healthy portions.  The emphasis is on diet quality:  Load up on vegetables and fruits - one-half of your plate: Aim for color and variety, and remember that potatoes don't count.  Go for whole grains - one-quarter of your plate: Whole wheat, barley, wheat berries, quinoa, oats, [place] rice, and foods made with them. If you want pasta, go with whole wheat pasta.  Protein power - one-quarter of your plate: Fish, chicken, beans, and nuts are all healthy, versatile protein sources. Limit red meat.  The diet, however, does go beyond the plate, offering a few other suggestions.  Use healthy plant oils, such as olive, canola, soy, corn, sunflower and peanut. Check the labels, and avoid partially hydrogenated oil, which have unhealthy trans fats.  If you're thirsty, drink water. Coffee and tea are good in moderation, but skip sugary drinks and limit milk and dairy products to one or two daily servings.  The type of carbohydrate in the diet is more important than the amount. Some sources of carbohydrates, such as vegetables, fruits, whole grains, and beans-are healthier than others.  Finally, stay  active.

## 2018-01-04 ENCOUNTER — Other Ambulatory Visit: Payer: Self-pay | Admitting: Physician Assistant

## 2018-01-04 DIAGNOSIS — E538 Deficiency of other specified B group vitamins: Secondary | ICD-10-CM

## 2018-02-22 ENCOUNTER — Other Ambulatory Visit: Payer: Self-pay | Admitting: Adult Health

## 2018-04-12 ENCOUNTER — Telehealth: Payer: Self-pay | Admitting: *Deleted

## 2018-04-12 NOTE — Telephone Encounter (Signed)
Walter Black,  Thank you for looking into this.  I documented that the previous polyp was 1.5 mm but looking at my images I think that that was a typo.  It was almost certainly 1.5 cm.  I removed piecemeal.  It was called hyperplastic but I had a good feeling that this was a sessile serrated polyp in fact.  I do recommend he have a colonoscopy around now.  It is reassuring to know that you look into these recalls in such detail.  Seriously thank you.  Radonna Ricker

## 2018-04-12 NOTE — Telephone Encounter (Signed)
Dr Ardis Hughs,  This pt had a recall letter of 5 yrs- last colon was 08-24-2011, He had 1 HPP, I cannot find any [facility] in his chart.  There was an OV with you in 2014 and in the note it states 10 yr recall.  I just want to be sure he needs a recall now, or if it should be 2023?  Please advise.  Thanks for your time, Lelan Pons

## 2018-04-19 ENCOUNTER — Ambulatory Visit (AMBULATORY_SURGERY_CENTER): Payer: Self-pay | Admitting: *Deleted

## 2018-04-19 ENCOUNTER — Encounter: Payer: Self-pay | Admitting: Gastroenterology

## 2018-04-19 ENCOUNTER — Ambulatory Visit: Payer: Self-pay

## 2018-04-19 VITALS — Ht 73.0 in | Wt 226.0 lb

## 2018-04-19 DIAGNOSIS — Z86010 Personal history of colonic polyps: Secondary | ICD-10-CM

## 2018-04-19 MED ORDER — PEG 3350-KCL-NA BICARB-NACL 420 G PO SOLR: 4000.0000 mL | Freq: Once | ORAL | 0 refills | Status: AC

## 2018-04-19 NOTE — Progress Notes (Signed)
Patient denies any allergies to eggs or soy. Patient denies any problems with anesthesia/sedation. Patient denies any oxygen use at home. Patient denies taking any diet/weight loss medications or blood thinners. EMMI education offered, pt declined.  

## 2018-04-21 ENCOUNTER — Ambulatory Visit (INDEPENDENT_AMBULATORY_CARE_PROVIDER_SITE_OTHER): Payer: 59 | Admitting: *Deleted

## 2018-04-21 DIAGNOSIS — Z23 Encounter for immunization: Secondary | ICD-10-CM

## 2018-04-26 ENCOUNTER — Other Ambulatory Visit: Payer: Self-pay | Admitting: Internal Medicine

## 2018-05-02 ENCOUNTER — Encounter: Payer: Self-pay | Admitting: Gastroenterology

## 2018-05-02 ENCOUNTER — Ambulatory Visit (AMBULATORY_SURGERY_CENTER): Payer: 59 | Admitting: Gastroenterology

## 2018-05-02 VITALS — BP 137/83 | HR 66 | Temp 96.6°F | Resp 18 | Ht 73.0 in | Wt 226.0 lb

## 2018-05-02 DIAGNOSIS — D128 Benign neoplasm of rectum: Secondary | ICD-10-CM | POA: Diagnosis not present

## 2018-05-02 DIAGNOSIS — K635 Polyp of colon: Secondary | ICD-10-CM | POA: Diagnosis not present

## 2018-05-02 DIAGNOSIS — Z86010 Personal history of colonic polyps: Secondary | ICD-10-CM

## 2018-05-02 DIAGNOSIS — D124 Benign neoplasm of descending colon: Secondary | ICD-10-CM

## 2018-05-02 DIAGNOSIS — D123 Benign neoplasm of transverse colon: Secondary | ICD-10-CM

## 2018-05-02 MED ORDER — SODIUM CHLORIDE 0.9 % IV SOLN: 500.0000 mL | Freq: Once | INTRAVENOUS | Status: DC

## 2018-05-02 NOTE — Op Note (Signed)
[place] Patient Name: Walter Black Procedure Date: 05/02/2018 8:54 AM MRN: [id number] Endoscopist: Milus Banister , MD Age: 57 Referring MD:  Date of Birth: August 13, 1960 Gender: Male Account #: [id number] Procedure:                Colonoscopy Indications:              High risk colon cancer surveillance: Personal                            history of colonic polyps; colonoscopy 2013 Dr.                            Ardis Hughs 1.5cm "hyperplastic" polyp which was likely                            SSP Medicines:                Monitored Anesthesia Care Procedure:                Pre-Anesthesia Assessment:                           - Prior to the procedure, a History and Physical                            was performed, and patient medications and                            allergies were reviewed. The patient's tolerance of                            previous anesthesia was also reviewed. The risks                            and benefits of the procedure and the sedation                            options and risks were discussed with the patient.                            All questions were answered, and informed consent                            was obtained. Prior Anticoagulants: The patient has                            taken no previous anticoagulant or antiplatelet                            agents. ASA Grade Assessment: II - A patient with                            mild systemic disease. After reviewing the risks  and benefits, the patient was deemed in                            satisfactory condition to undergo the procedure.                           After obtaining informed consent, the colonoscope                            was passed under direct vision. Throughout the                            procedure, the patient's blood pressure, pulse, and                            oxygen saturations were monitored continuously. The                  Colonoscope was introduced through the anus and                            advanced to the the cecum, identified by                            appendiceal orifice and ileocecal valve. The                            colonoscopy was performed without difficulty. The                            patient tolerated the procedure well. The quality                            of the bowel preparation was good. The ileocecal                            valve, appendiceal orifice, and rectum were                            photographed. Scope In: 9:11:25 AM Scope Out: 9:33:24 AM Scope Withdrawal Time: 0 hours 19 minutes 32 seconds  Total Procedure Duration: 0 hours 21 minutes 59 seconds  Findings:                 Four sessile polyps were found in the rectum,                            descending colon and transverse colon. The polyps                            were 2 to 8 mm in size. These polyps were removed                            with a cold snare. Resection and retrieval were  complete.                           The exam was otherwise without abnormality on                            direct and retroflexion views. Complications:            No immediate complications. Estimated blood loss:                            None. Estimated Blood Loss:     Estimated blood loss: none. Impression:               - Four 2 to 8 mm polyps in the rectum, in the                            descending colon and in the transverse colon,                            removed with a cold snare. Resected and retrieved.                           - The examination was otherwise normal on direct                            and retroflexion views. Recommendation:           - Patient has a contact number available for                            emergencies. The signs and symptoms of potential                            delayed complications were discussed with the                             patient. Return to normal activities tomorrow.                            Written discharge instructions were provided to the                            patient.                           - Resume previous diet.                           - Continue present medications.                           You will receive a letter within 2-3 weeks with the                            pathology results and my final recommendations.  If the polyp(s) is proven to be 'pre-cancerous' on                            pathology, you will need repeat colonoscopy in 3-5                            years. If the polyp(s) is NOT 'precancerous' on                            pathology then you should repeat colon cancer                            screening in 10 years with colonoscopy without need                            for colon cancer screening by any method prior to                            then (including stool testing). Milus Banister, MD 05/02/2018 9:36:16 AM This report has been signed electronically.

## 2018-05-02 NOTE — Progress Notes (Signed)
Report given to PACU, vss 

## 2018-05-02 NOTE — Progress Notes (Signed)
Called to room to assist during endoscopic procedure.  Patient ID and intended procedure confirmed with present staff. Received instructions for my participation in the procedure from the performing physician.  

## 2018-05-03 ENCOUNTER — Telehealth: Payer: Self-pay

## 2018-05-03 ENCOUNTER — Telehealth: Payer: Self-pay | Admitting: *Deleted

## 2018-05-03 NOTE — Telephone Encounter (Signed)
No answer for post procedure call back. Left message and will attempt to call back later this afternoon. SM 

## 2018-05-03 NOTE — Telephone Encounter (Signed)
Attempted to reach patient for post-procedure f/u call. No answer. This is the 2nd call. Left message for him to please not hesitate to call us if he has any questions/concerns regarding his care. 

## 2018-05-09 ENCOUNTER — Encounter: Payer: Self-pay | Admitting: Gastroenterology

## 2018-05-23 ENCOUNTER — Encounter: Payer: Self-pay | Admitting: Adult Health

## 2018-06-19 ENCOUNTER — Other Ambulatory Visit: Payer: Self-pay | Admitting: Adult Health

## 2018-06-19 DIAGNOSIS — I10 Essential (primary) hypertension: Secondary | ICD-10-CM

## 2018-06-20 ENCOUNTER — Encounter: Payer: Self-pay | Admitting: Adult Health

## 2018-06-20 DIAGNOSIS — E538 Deficiency of other specified B group vitamins: Secondary | ICD-10-CM | POA: Insufficient documentation

## 2018-06-20 NOTE — Progress Notes (Signed)
Complete Physical  Assessment and Plan:  Walter Black was seen today for annual exam.  Diagnoses and all orders for this visit:  Encounter for general adult medical examination with abnormal findings  Essential hypertension Well controlled on current medication Monitor blood pressure at home; call if consistently over 130/80 Continue DASH diet.   Reminder to go to the ER if any CP, SOB, nausea, dizziness, severe HA, changes vision/speech, left arm numbness and tingling and jaw pain. -     Magnesium  Irritable bowel syndrome with diarrhea       -     Takes citrucel daily; symptoms stable  - with occasional loose stool   Personal history of colonic polyps Just had in 2019, due 5 year follow up  DDD (degenerative disc disease), lumbar       -     Had back injections, has been stable for past 4 years after weight loss and with core strengthening  Erectile dysfunction, unspecified erectile dysfunction type        -    Sildenafil via mail order - working well   Environmental allergies       -      Avoid triggers; well managed with current singulaire + PRN medications  B12 deficiency -     Vitamin B12  Screening for thyroid disorder -     TSH  Screening for hyperlipidemia -     Lipid panel  Screening for diabetes mellitus -     Hemoglobin A1c  Screening for cardiovascular condition -     EKG 12-Lead  Screening for prostate cancer -     PSA  Screening for hematuria or proteinuria -     Urinalysis, Complete (81001)  Medication management -     CBC with Differential/Platelet -     CMP/GFR  Vitamin D deficiency -     VITAMIN D 25 Hydroxy (Vit-D Deficiency, Fractures)  Discussed med's effects and SE's. Screening labs and tests as requested with regular follow-up as recommended. 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 His medical problems includes:  has IBS (irritable bowel syndrome) - mild w diarrhea; Hypertension; Environmental allergies; DDD (degenerative disc disease), lumbar; ED (erectile dysfunction); Personal history of colonic polyps; Overweight (BMI 25.0-29.9); and B12 deficiency on their problem list.   Followed by Dr. Allyson Sabal (derm) - for rash/skin concerns and routine check ups. 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    BMI is Body mass index is 30.08 kg/m., he has been working on diet and exercise. Exercises 5 days a week. He is trying to get down to 210 lb. He reports ~60 fluid ounces of water intake daily. He estimates 7 hours of sleep nightly. He drinks 3-4 cups of coffee daily.  He admits to 2-3 beers nightly.  Wt Readings from Last 3 Encounters:  06/21/18 221 lb 12.8 oz (100.6 kg)  05/02/18 226 lb (102.5 kg)  04/19/18 226 lb (102.5 kg)   Today their BP is BP: 124/84 He does workout. He denies chest pain, shortness of breath, dizziness.   He is not on cholesterol medication and denies myalgias. His cholesterol is  at goal. The cholesterol last visit was:   Lab Results  Component Value Date   CHOL 160 05/20/2017   HDL 59 05/20/2017   LDLCALC 81 05/20/2017   TRIG 100 05/20/2017   CHOLHDL 2.7 05/20/2017    Last A1C in the office was:  Lab Results  Component Value Date   HGBA1C 5.1 05/20/2017   Last GFR: Lab Results  Component Value Date   GFRNONAA 99 12/30/2017   Patient is not on Vitamin D supplement and low at last visit:   Lab Results  Component Value Date   VD25OH 38 05/20/2017     Last PSA was: Lab Results  Component Value Date   PSA 0.8 05/20/2017    Current Medications:  Current Outpatient Medications on File Prior to Visit   Medication Sig Dispense Refill  . fluticasone (CUTIVATE) 0.05 % cream Apply topically 2 (two) times daily as needed. 60 g 2  . fluticasone (FLONASE) 50 MCG/ACT nasal spray 1 spray both nostrils in a.m. 48 g 3  . ipratropium (ATROVENT) 0.06 % nasal spray USE 2 SPRAYS NASALLY 3  TIMES DAILY 75 mL 0  . losartan (COZAAR) 50 MG tablet TAKE 1 TABLET BY MOUTH  DAILY 90 tablet 0  . Methylcellulose, Laxative, (CITRUCEL PO) Take by mouth.    . montelukast (SINGULAIR) 10 MG tablet TAKE 1 TABLET BY MOUTH AT  BEDTIME 90 tablet 3  . sildenafil (VIAGRA) 50 MG tablet Take 50 mg daily as needed by mouth for erectile dysfunction.     No current facility-administered medications on file prior to visit.    Allergies:  Allergies  Allergen Reactions  . Hctz [Hydrochlorothiazide]     ED  . Morphine And Related Other (See Comments)    " PASSED OUT" PER PT   Health Maintenance:  Immunization History  Administered Date(s) Administered  . Influenza Inj Mdck Quad With Preservative 05/20/2017, 04/21/2018  . Influenza Split 05/10/2014  . Influenza, Seasonal, Injecte, Preservative Fre 05/22/2015  . Influenza,inj,quad, With Preservative 05/21/2016  . Influenza-Unspecified 05/06/2011, 04/18/2013  . PPD Test 05/10/2014  . Tdap 07/29/2011    Tetanus: 2013 Flu vaccine: 2019 Shingrix: declines  Colonoscopy: 2019 - polyp biopsied - due 5 years EGD: n/a   Eye Exam: several years ago - encouraged to follow up with this Dentist: Dr. Gloriann Loan - q6 months - no issues Derm:   Patient Care Team: Unk Pinto, MD as PCP - General (Internal Medicine) Milus Banister, MD as Consulting Physician (Gastroenterology) Michael Boston, MD as Consulting Physician (General Surgery) Monna Fam, MD as Consulting Physician (Ophthalmology) Kathie Rhodes, MD as Consulting Physician (Urology) Suella Broad, MD as Consulting Physician (Physical Medicine and Rehabilitation) Druscilla Brownie, MD as Consulting Physician  (Dermatology) Mosetta Anis, MD as Referring Physician (Allergy)  Medical History:  has IBS (irritable bowel syndrome) - mild w diarrhea; Hypertension; Environmental allergies; DDD (degenerative disc disease), lumbar; ED (erectile dysfunction); Personal history of colonic polyps; Overweight (BMI 25.0-29.9); and B12 deficiency on their problem list. 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 Family History:  His family history includes Cancer in his mother; Diabetes in his mother. Social History:   reports that he has been smoking cigars. He has smoked for the past 30.00 years. He has never used smokeless tobacco. He reports current alcohol use of about 14.0 standard drinks of alcohol per week. He  reports that he does not use drugs. Review of Systems:  Review of Systems  Constitutional: Negative for chills, fever, malaise/fatigue and weight loss.  HENT: Negative for congestion, hearing loss, sinus pain, sore throat and tinnitus.   Eyes: Negative for blurred vision and double vision.  Respiratory: Negative for cough, sputum production, shortness of breath and wheezing.   Cardiovascular: Negative for chest pain, palpitations, orthopnea, claudication and leg swelling.  Gastrointestinal: Negative for abdominal pain, blood in stool, constipation, diarrhea, heartburn, melena, nausea and vomiting.  Genitourinary: Negative.  Negative for dysuria, frequency, hematuria and urgency.  Musculoskeletal: Negative for back pain, joint pain and myalgias.  Skin: Negative for rash.  Neurological: Negative for dizziness, tingling, sensory change, weakness and headaches.  Endo/Heme/Allergies: Positive for environmental allergies. Negative for polydipsia.  Psychiatric/Behavioral: Negative.  Negative for depression and substance abuse. The patient is not  nervous/anxious and does not have insomnia.   All other systems reviewed and are negative.   Physical Exam: Estimated body mass index is 30.08 kg/m as calculated from the following:   Height as of this encounter: 6' (1.829 m).   Weight as of this encounter: 221 lb 12.8 oz (100.6 kg). BP 124/84   Pulse 64   Temp (!) 97.5 F (36.4 C)   Ht 6' (1.829 m)   Wt 221 lb 12.8 oz (100.6 kg)   SpO2 97%   BMI 30.08 kg/m  General Appearance: Well nourished, in no apparent distress.  Eyes: PERRLA, EOMs, conjunctiva no swelling or erythema, normal fundi and vessels.  Sinuses: No Frontal/maxillary tenderness  ENT/Mouth: Ext aud canals clear, normal light reflex with TMs without erythema, bulging. Good dentition. No erythema, swelling, or exudate on post pharynx. Tonsils not swollen or erythematous. Hearing normal.  Neck: Supple, thyroid normal. No bruits  Respiratory: Respiratory effort normal, BS equal bilaterally without rales, rhonchi, wheezing or stridor.  Cardio: RRR without murmurs, rubs or gallops. Brisk peripheral pulses without edema.  Chest: symmetric, with normal excursions and percussion.  Abdomen: Soft, nontender, no guarding, rebound, hernias, masses, or organomegaly.  Lymphatics: Non tender without lymphadenopathy.  Genitourinary: Defer, no issues Musculoskeletal: Full ROM all peripheral extremities, 5/5 strength, and normal gait.  Skin: Warm, dry without rashes, lesions, ecchymosis. Neuro: Cranial nerves intact, reflexes equal bilaterally. Normal muscle tone, no cerebellar symptoms. Sensation intact.  Psych: Awake and oriented X 3, normal affect, Insight and Judgment appropriate.   EKG: WNL no changes.  Izora Ribas 10:17 AM [facility]

## 2018-06-21 ENCOUNTER — Encounter: Payer: Self-pay | Admitting: Adult Health

## 2018-06-21 ENCOUNTER — Ambulatory Visit: Payer: 59 | Admitting: Adult Health

## 2018-06-21 VITALS — BP 124/84 | HR 64 | Temp 97.5°F | Ht 72.0 in | Wt 221.8 lb

## 2018-06-21 DIAGNOSIS — Z0000 Encounter for general adult medical examination without abnormal findings: Secondary | ICD-10-CM | POA: Diagnosis not present

## 2018-06-21 DIAGNOSIS — Z131 Encounter for screening for diabetes mellitus: Secondary | ICD-10-CM

## 2018-06-21 DIAGNOSIS — Z86010 Personal history of colonic polyps: Secondary | ICD-10-CM

## 2018-06-21 DIAGNOSIS — Z1329 Encounter for screening for other suspected endocrine disorder: Secondary | ICD-10-CM

## 2018-06-21 DIAGNOSIS — E663 Overweight: Secondary | ICD-10-CM

## 2018-06-21 DIAGNOSIS — I10 Essential (primary) hypertension: Secondary | ICD-10-CM

## 2018-06-21 DIAGNOSIS — Z9109 Other allergy status, other than to drugs and biological substances: Secondary | ICD-10-CM

## 2018-06-21 DIAGNOSIS — K580 Irritable bowel syndrome with diarrhea: Secondary | ICD-10-CM

## 2018-06-21 DIAGNOSIS — Z125 Encounter for screening for malignant neoplasm of prostate: Secondary | ICD-10-CM

## 2018-06-21 DIAGNOSIS — Z136 Encounter for screening for cardiovascular disorders: Secondary | ICD-10-CM

## 2018-06-21 DIAGNOSIS — Z0001 Encounter for general adult medical examination with abnormal findings: Secondary | ICD-10-CM

## 2018-06-21 DIAGNOSIS — Z13220 Encounter for screening for lipoid disorders: Secondary | ICD-10-CM

## 2018-06-21 DIAGNOSIS — N529 Male erectile dysfunction, unspecified: Secondary | ICD-10-CM

## 2018-06-21 DIAGNOSIS — E559 Vitamin D deficiency, unspecified: Secondary | ICD-10-CM

## 2018-06-21 DIAGNOSIS — E538 Deficiency of other specified B group vitamins: Secondary | ICD-10-CM

## 2018-06-21 DIAGNOSIS — M5136 Other intervertebral disc degeneration, lumbar region: Secondary | ICD-10-CM

## 2018-06-21 MED ORDER — IPRATROPIUM BROMIDE 0.06 % NA SOLN: NASAL | 0 refills | Status: DC

## 2018-06-21 MED ORDER — LOSARTAN POTASSIUM 50 MG PO TABS: 50.0000 mg | ORAL_TABLET | Freq: Every day | ORAL | 1 refills | Status: DC

## 2018-06-21 MED ORDER — FLUTICASONE PROPIONATE 50 MCG/ACT NA SUSP: NASAL | 3 refills | Status: DC

## 2018-06-21 NOTE — Patient Instructions (Addendum)
  Mr. Scadden , Thank you for taking time to come for your Annual Wellness Visit. I appreciate your ongoing commitment to your health goals. Please review the following plan we discussed and let me know if I can assist you in the future.   These are the goals we discussed: Goals    . Blood Pressure < 130/80    . Weight (lb) < 210 lb (95.3 kg)       This is a list of the screening recommended for you and due dates:  Health Maintenance  Topic Date Due  . Tetanus Vaccine  07/28/2021  . Colon Cancer Screening  05/03/2023  . Flu Shot  Completed  .  Hepatitis C: One time screening is recommended by Center for Disease Control  (CDC) for  adults born from [birth year] through 1965.   Completed  . HIV Screening  Completed    Recommend getting your eyes examined     Know what a healthy weight is for you (roughly BMI <25) and aim to maintain this  Aim for 7+ servings of fruits and vegetables daily  65-80+ fluid ounces of water or unsweet tea for healthy kidneys  Limit to max 1 drink of alcohol per day; avoid smoking/tobacco  Limit animal fats in diet for cholesterol and heart health - choose grass fed whenever available  Avoid highly processed foods, and foods high in saturated/trans fats  Aim for low stress - take time to unwind and care for your mental health  Aim for 150 min of moderate intensity exercise weekly for heart health, and weights twice weekly for bone health  Aim for 7-9 hours of sleep daily       When it comes to diets, agreement about the perfect plan isn't easy to find, even among the experts. Experts at the [employer] developed an idea known as the Healthy Eating Plate. Just imagine a plate divided into logical, healthy portions.  The emphasis is on diet quality:  Load up on vegetables and fruits - one-half of your plate: Aim for color and variety, and remember that potatoes don't count.  Go for whole grains - one-quarter of your plate: Whole  wheat, barley, wheat berries, quinoa, oats, [place] rice, and foods made with them. If you want pasta, go with whole wheat pasta.  Protein power - one-quarter of your plate: Fish, chicken, beans, and nuts are all healthy, versatile protein sources. Limit red meat.  The diet, however, does go beyond the plate, offering a few other suggestions.  Use healthy plant oils, such as olive, canola, soy, corn, sunflower and peanut. Check the labels, and avoid partially hydrogenated oil, which have unhealthy trans fats.  If you're thirsty, drink water. Coffee and tea are good in moderation, but skip sugary drinks and limit milk and dairy products to one or two daily servings.  The type of carbohydrate in the diet is more important than the amount. Some sources of carbohydrates, such as vegetables, fruits, whole grains, and beans-are healthier than others.  Finally, stay active.

## 2018-06-22 LAB — LIPID PANEL
Cholesterol: 149 mg/dL (ref <200)
HDL: 58 mg/dL (ref >40)
LDL Cholesterol (Calc): 77 mg/dL (calc)
Non-HDL Cholesterol (Calc): 91 mg/dL (calc) (ref <130)
Total CHOL/HDL Ratio: 2.6 (calc) (ref <5.0)
Triglycerides: 60 mg/dL (ref <150)

## 2018-06-22 LAB — COMPLETE METABOLIC PANEL WITH GFR
AG Ratio: 2 (calc) (ref 1.0–2.5)
ALT: 16 U/L (ref 9–46)
AST: 17 U/L (ref 10–35)
Albumin: 4.5 g/dL (ref 3.6–5.1)
Alkaline phosphatase (APISO): 62 U/L (ref 40–115)
BUN: 15 mg/dL (ref 7–25)
CO2: 31 mmol/L (ref 20–32)
Calcium: 9.7 mg/dL (ref 8.6–10.3)
Chloride: 102 mmol/L (ref 98–110)
Creat: 0.78 mg/dL (ref 0.70–1.33)
GFR, Est African American: 116 mL/min/{1.73_m2} (ref >=60)
GFR, Est Non African American: 100 mL/min/{1.73_m2} (ref >=60)
Globulin: 2.3 g/dL (calc) (ref 1.9–3.7)
Glucose, Bld: 104 mg/dL — ABNORMAL HIGH (ref 65–99)
Potassium: 4.3 mmol/L (ref 3.5–5.3)
SODIUM: 140 mmol/L (ref 135–146)
Total Bilirubin: 0.5 mg/dL (ref 0.2–1.2)
Total Protein: 6.8 g/dL (ref 6.1–8.1)

## 2018-06-22 LAB — CBC WITH DIFFERENTIAL/PLATELET
Absolute Monocytes: 649 cells/uL (ref 200–950)
BASOS PCT: 0.7 %
Basophils Absolute: 41 cells/uL (ref 0–200)
EOS ABS: 207 {cells}/uL (ref 15–500)
Eosinophils Relative: 3.5 %
HCT: 44.2 % (ref 38.5–50.0)
Hemoglobin: 15.4 g/dL (ref 13.2–17.1)
Lymphs Abs: 1918 cells/uL (ref 850–3900)
MCH: 32.7 pg (ref 27.0–33.0)
MCHC: 34.8 g/dL (ref 32.0–36.0)
MCV: 93.8 fL (ref 80.0–100.0)
MONOS PCT: 11 %
MPV: 9.9 fL (ref 7.5–12.5)
NEUTROS PCT: 52.3 %
Neutro Abs: 3086 cells/uL (ref 1500–7800)
Platelets: 222 10*3/uL (ref 140–400)
RBC: 4.71 10*6/uL (ref 4.20–5.80)
RDW: 11.5 % (ref 11.0–15.0)
Total Lymphocyte: 32.5 %
WBC: 5.9 10*3/uL (ref 3.8–10.8)

## 2018-06-22 LAB — HEMOGLOBIN A1C
EAG (MMOL/L): 5.7 (calc)
Hgb A1c MFr Bld: 5.2 % of total Hgb (ref <5.7)
Mean Plasma Glucose: 103 (calc)

## 2018-06-22 LAB — URINALYSIS, ROUTINE W REFLEX MICROSCOPIC
Bilirubin Urine: NEGATIVE
Glucose, UA: NEGATIVE
Hgb urine dipstick: NEGATIVE
Ketones, ur: NEGATIVE
Leukocytes, UA: NEGATIVE
NITRITE: NEGATIVE
PROTEIN: NEGATIVE
Specific Gravity, Urine: 1.009 (ref 1.001–1.03)
pH: 6.5 (ref 5.0–8.0)

## 2018-06-22 LAB — VITAMIN D 25 HYDROXY (VIT D DEFICIENCY, FRACTURES): Vit D, 25-Hydroxy: 22 ng/mL — ABNORMAL LOW (ref 30–100)

## 2018-06-22 LAB — PSA: PSA: 0.7 ng/mL (ref <=4.0)

## 2018-06-22 LAB — MAGNESIUM: Magnesium: 2 mg/dL (ref 1.5–2.5)

## 2018-06-22 LAB — TSH: TSH: 3.13 mIU/L (ref 0.40–4.50)

## 2018-06-22 LAB — VITAMIN B12: VITAMIN B 12: 283 pg/mL (ref 200–1100)

## 2018-11-24 ENCOUNTER — Other Ambulatory Visit: Payer: Self-pay | Admitting: Adult Health

## 2018-11-24 DIAGNOSIS — I10 Essential (primary) hypertension: Secondary | ICD-10-CM

## 2018-12-19 DIAGNOSIS — E559 Vitamin D deficiency, unspecified: Secondary | ICD-10-CM | POA: Insufficient documentation

## 2018-12-19 NOTE — Progress Notes (Signed)
FOLLOW UP  Assessment and Plan:   Hypertension Well controlled with current medications  Monitor blood pressure at home; patient to call if consistently greater than 130/80 Continue DASH diet.   Reminder to go to the ER if any CP, SOB, nausea, dizziness, severe HA, changes vision/speech, left arm numbness and tingling and jaw pain.  Overweight Long discussion about weight loss, diet, and exercise Recommended diet heavy in fruits and veggies and low in animal meats, cheeses, and dairy products, appropriate calorie intake Discussed ideal weight for height - he has reached weight goal, discussed strategies to maintain Will follow up in 6 months  Irritable bowel syndrome with diarrhea       -     Takes citrucel daily; symptoms stable  - with occasional loose stool   Vitamin D deficiency Continue supplementation for goal of 60-100 Check vitamin D level   Continue diet and meds as discussed. Further disposition pending results of labs. Discussed med's effects and SE's.   Over 15 minutes of exam, counseling, chart review, and critical decision making was performed.   Future Appointments  Date Time Provider [place]  06/26/2019 10:00 AM Liane Comber, NP [facility] None    ----------------------------------------------------------------------------------------------------------------------  HPI 58 y.o. male  presents for 6 month follow up on hypertension, weight and vitamin D deficiency.   He has mild IBS-D improved with daily citrucel supplement.   BMI is Body mass index is 27.48 kg/m., he has been working on diet, but works out 5 days a week, and has reached initial goal os <210lb.   Wt Readings from Last 3 Encounters:  12/22/18 202 lb 9.6 oz (91.9 kg)  06/21/18 221 lb 12.8 oz (100.6 kg)  05/02/18 226 lb (102.5 kg)   His blood pressure has been controlled at home, today their BP is BP: 122/86  He does workout. He denies chest pain, shortness of breath,  dizziness.  The cholesterol last visit was:   Lab Results  Component Value Date   CHOL 149 06/21/2018   HDL 58 06/21/2018   LDLCALC 77 06/21/2018   TRIG 60 06/21/2018   CHOLHDL 2.6 06/21/2018   Last A1C in the office was:  Lab Results  Component Value Date   HGBA1C 5.2 06/21/2018   Patient has NOT initiated Vitamin D supplement.   Lab Results  Component Value Date   VD25OH 22 (L) 06/21/2018     He has started liquid b12 supplement daily  Lab Results  Component Value Date   VITAMINB12 283 06/21/2018      Current Medications:  Current Outpatient Medications on File Prior to Visit  Medication Sig  . CHOLECALCIFEROL PO Take 1 tablet by mouth daily.  . Cyanocobalamin (B12 LIQUID HEALTH BOOSTER PO) Take 1 applicator by mouth daily.  . fluticasone (FLONASE) 50 MCG/ACT nasal spray 1 spray both nostrils in a.m.  Marland Kitchen ipratropium (ATROVENT) 0.06 % nasal spray USE 2 SPRAYS NASALLY 3  TIMES DAILY  . losartan (COZAAR) 50 MG tablet TAKE 1 TABLET BY MOUTH  DAILY  . Methylcellulose, Laxative, (CITRUCEL PO) Take by mouth.  . sildenafil (VIAGRA) 50 MG tablet Take 50 mg daily as needed by mouth for erectile dysfunction.   No current facility-administered medications on file prior to visit.      Allergies:  Allergies  Allergen Reactions  . Hctz [Hydrochlorothiazide]     ED  . Morphine And Related Other (See Comments)    " PASSED OUT" PER PT     Medical History:  Past Medical History:  Diagnosis Date  . Allergy   . Arthritis    hips now resolved s/p surgery; still has it in lower back  . B12 deficiency   . Cat allergies   . Colon polyp   . DDD (degenerative disc disease), lumbar   . ED (erectile dysfunction)   . Environmental allergies   . Hypertension 2000  . 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  HENT: Negative for hearing loss and tinnitus.   Eyes: Negative for blurred vision and double vision.  Respiratory: Negative for cough, shortness of breath and wheezing.   Cardiovascular: Negative for chest pain, palpitations, orthopnea, claudication and leg swelling.  Gastrointestinal: Negative for abdominal pain, blood in stool, constipation, diarrhea, heartburn, melena, nausea and vomiting.  Genitourinary: Negative.   Musculoskeletal: Negative for joint pain and myalgias.  Skin: Negative for rash.  Neurological: Negative for dizziness, tingling, sensory change, weakness and headaches.  Endo/Heme/Allergies: Negative for polydipsia.  Psychiatric/Behavioral: Negative.   All other systems reviewed and are negative.     Physical Exam: BP 122/86   Pulse 73   Temp 97.9 F (36.6 C)   Ht 6' (1.829 m)   Wt 202 lb 9.6 oz (91.9 kg)   SpO2 96%   BMI 27.48 kg/m  Wt Readings from Last 3 Encounters:  12/22/18 202 lb 9.6 oz (91.9 kg)  06/21/18 221 lb 12.8 oz (100.6 kg)  05/02/18 226 lb (102.5 kg)   General Appearance: Well nourished, in no apparent distress. Eyes: PERRLA, EOMs, conjunctiva no swelling or erythema Sinuses: No Frontal/maxillary tenderness ENT/Mouth: Ext aud canals clear, TMs without erythema, bulging. No erythema, swelling, or exudate on post pharynx.  Tonsils not swollen or erythematous. Hearing normal.  Neck: Supple, thyroid normal.  Respiratory: Respiratory effort normal, BS equal bilaterally without rales, rhonchi, wheezing or stridor.  Cardio: RRR with no MRGs. Brisk peripheral pulses without edema.  Abdomen: Soft, + BS.  Non tender, no guarding, rebound, hernias, masses. Lymphatics: Non tender without lymphadenopathy.  Musculoskeletal: Full ROM, 5/5 strength, Normal gait Skin: Warm, dry without rashes, lesions, ecchymosis.  Neuro: Cranial nerves intact. No cerebellar symptoms.  Psych: Awake and oriented X 3, normal affect, Insight and Judgment  appropriate.    Izora Ribas, NP 9:06 AM [facility]

## 2018-12-22 ENCOUNTER — Ambulatory Visit: Payer: 59 | Admitting: Adult Health

## 2018-12-22 ENCOUNTER — Encounter: Payer: Self-pay | Admitting: Adult Health

## 2018-12-22 ENCOUNTER — Other Ambulatory Visit: Payer: Self-pay

## 2018-12-22 VITALS — BP 122/86 | HR 73 | Temp 97.9°F | Ht 72.0 in | Wt 202.6 lb

## 2018-12-22 DIAGNOSIS — N529 Male erectile dysfunction, unspecified: Secondary | ICD-10-CM | POA: Diagnosis not present

## 2018-12-22 DIAGNOSIS — I10 Essential (primary) hypertension: Secondary | ICD-10-CM

## 2018-12-22 DIAGNOSIS — E663 Overweight: Secondary | ICD-10-CM

## 2018-12-22 DIAGNOSIS — K580 Irritable bowel syndrome with diarrhea: Secondary | ICD-10-CM | POA: Diagnosis not present

## 2018-12-22 DIAGNOSIS — E559 Vitamin D deficiency, unspecified: Secondary | ICD-10-CM

## 2018-12-22 NOTE — Patient Instructions (Addendum)
Goals    . Blood Pressure < 130/80    . Weight (lb) < 200 lb (90.7 kg)       Recommend getting on vitamin D supplement - 5000 IU daily     Aim for 7+ servings of fruits and vegetables daily  65-80+ fluid ounces of water or unsweet tea for healthy kidneys  Limit to max 1 drink of alcohol per day; avoid smoking/tobacco  Limit animal fats in diet for cholesterol and heart health - choose grass fed whenever available  Avoid highly processed foods, and foods high in saturated/trans fats  Aim for low stress - take time to unwind and care for your mental health  Aim for 150 min of moderate intensity exercise weekly for heart health, and weights twice weekly for bone health  Aim for 7-9 hours of sleep daily    Drink 1/2 your body weight in fluid ounces of water daily; drink a tall glass of water 30 min before meals  Don't eat until you're stuffed- listen to your stomach and eat until you are 80% full   Try eating off of a salad plate; wait 10 min after finishing before going back for seconds  Start by eating the vegetables on your plate; aim for 50% of your meals to be fruits or vegetables  Then eat your protein - lean meats (grass fed if possible), fish, beans, nuts in moderation  Eat your carbs/starch last ONLY if you still are hungry. If you can, stop before finishing it all  Avoid sugar and flour - the closer it looks to it's original form in nature, typically the better it is for you  Splurge in moderation - "assign" days when you get to splurge and have the "bad stuff" - I like to follow a 80% - 20% plan- "good" choices 80 % of the time, "bad" choices in moderation 20% of the time  Simple equation is: Calories out > calories in = weight loss - even if you eat the bad stuff, if you limit portions, you will still lose weight

## 2018-12-23 LAB — COMPLETE METABOLIC PANEL WITH GFR
AG Ratio: 2 (calc) (ref 1.0–2.5)
ALT: 10 U/L (ref 9–46)
AST: 15 U/L (ref 10–35)
Albumin: 4.7 g/dL (ref 3.6–5.1)
Alkaline phosphatase (APISO): 47 U/L (ref 35–144)
BUN: 10 mg/dL (ref 7–25)
CO2: 29 mmol/L (ref 20–32)
Calcium: 9.7 mg/dL (ref 8.6–10.3)
Chloride: 102 mmol/L (ref 98–110)
Creat: 0.72 mg/dL (ref 0.70–1.33)
GFR, Est African American: 120 mL/min/{1.73_m2} (ref >=60)
GFR, Est Non African American: 104 mL/min/{1.73_m2} (ref >=60)
Globulin: 2.4 g/dL (calc) (ref 1.9–3.7)
Glucose, Bld: 88 mg/dL (ref 65–99)
Potassium: 4.5 mmol/L (ref 3.5–5.3)
Sodium: 138 mmol/L (ref 135–146)
Total Bilirubin: 0.7 mg/dL (ref 0.2–1.2)
Total Protein: 7.1 g/dL (ref 6.1–8.1)

## 2018-12-23 LAB — MAGNESIUM: Magnesium: 2 mg/dL (ref 1.5–2.5)

## 2018-12-23 LAB — CBC WITH DIFFERENTIAL/PLATELET
Absolute Monocytes: 502 cells/uL (ref 200–950)
Basophils Absolute: 38 cells/uL (ref 0–200)
Basophils Relative: 0.7 %
Eosinophils Absolute: 173 cells/uL (ref 15–500)
Eosinophils Relative: 3.2 %
HCT: 44.2 % (ref 38.5–50.0)
Hemoglobin: 15.6 g/dL (ref 13.2–17.1)
Lymphs Abs: 1701 cells/uL (ref 850–3900)
MCH: 33.4 pg — ABNORMAL HIGH (ref 27.0–33.0)
MCHC: 35.3 g/dL (ref 32.0–36.0)
MCV: 94.6 fL (ref 80.0–100.0)
MPV: 9.6 fL (ref 7.5–12.5)
Monocytes Relative: 9.3 %
Neutro Abs: 2986 cells/uL (ref 1500–7800)
Neutrophils Relative %: 55.3 %
Platelets: 253 10*3/uL (ref 140–400)
RBC: 4.67 10*6/uL (ref 4.20–5.80)
RDW: 11.7 % (ref 11.0–15.0)
Total Lymphocyte: 31.5 %
WBC: 5.4 10*3/uL (ref 3.8–10.8)

## 2019-03-15 ENCOUNTER — Other Ambulatory Visit: Payer: Self-pay | Admitting: Adult Health

## 2019-04-20 ENCOUNTER — Other Ambulatory Visit: Payer: Self-pay

## 2019-04-20 ENCOUNTER — Ambulatory Visit (INDEPENDENT_AMBULATORY_CARE_PROVIDER_SITE_OTHER): Payer: 59

## 2019-04-20 VITALS — Temp 97.5°F

## 2019-04-20 DIAGNOSIS — Z23 Encounter for immunization: Secondary | ICD-10-CM | POA: Diagnosis not present

## 2019-04-20 NOTE — Progress Notes (Signed)
Patient presents to the office for vaccination against influenza. Vaccine administered in left arm without complications. Temperature taken and recorded.

## 2019-05-08 ENCOUNTER — Other Ambulatory Visit: Payer: Self-pay | Admitting: Adult Health

## 2019-05-08 DIAGNOSIS — I10 Essential (primary) hypertension: Secondary | ICD-10-CM

## 2019-05-24 ENCOUNTER — Encounter: Payer: Self-pay | Admitting: Adult Health

## 2019-06-01 ENCOUNTER — Other Ambulatory Visit: Payer: Self-pay | Admitting: Adult Health

## 2019-06-22 NOTE — Progress Notes (Deleted)
Complete Physical  Assessment and Plan:  Walter Black was seen today for annual exam.  Diagnoses and all orders for this visit:  Encounter for general adult medical examination with abnormal findings  Essential hypertension Well controlled on current medication Monitor blood pressure at home; call if consistently over 130/80 Continue DASH diet.   Reminder to go to the ER if any CP, SOB, nausea, dizziness, severe HA, changes vision/speech, left arm numbness and tingling and jaw pain. -     Magnesium  Irritable bowel syndrome with diarrhea       -     Takes citrucel daily; symptoms stable  - with occasional loose stool   Personal history of colonic polyps Just had in 2019, due 5 year follow up  DDD (degenerative disc disease), lumbar       -     Had back injections, has been stable for past 4 years after weight loss and with core strengthening  Erectile dysfunction, unspecified erectile dysfunction type        -    Sildenafil via mail order - working well   Environmental allergies       -      Avoid triggers; well managed with current singulaire + PRN medications  B12 deficiency -     Vitamin B12  Screening for thyroid disorder -     TSH  Screening for hyperlipidemia -     Lipid panel  Screening for diabetes mellitus -     Hemoglobin A1c  Screening for cardiovascular condition -     EKG 12-Lead  Screening for prostate cancer -     PSA  Screening for hematuria or proteinuria -     Urinalysis, Complete (81001)  Medication management -     CBC with Differential/Platelet -     CMP/GFR  Vitamin D deficiency -     VITAMIN D 25 Hydroxy (Vit-D Deficiency, Fractures)  Discussed med's effects and SE's. Screening labs and tests as requested with regular follow-up as recommended. Over 40 minutes of exam, counseling, chart review and critical decision making was performed  Future Appointments  Date Time Provider [place]  06/26/2019 10:00 AM Liane Comber, NP  [facility] None  06/25/2020 10:00 AM Liane Comber, NP [facility] None    HPI 58 y/o Male Patient,  presents for a complete physical. His medical problems includes:  has IBS (irritable bowel syndrome) - mild w diarrhea; Hypertension; Environmental allergies; DDD (degenerative disc disease), lumbar; ED (erectile dysfunction); Personal history of colonic polyps; Overweight (BMI 25.0-29.9); B12 deficiency; and Vitamin D deficiency on their problem list.   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 Allyson Sabal (derm) - for rash/skin concerns and routine check ups. No concerns today, declines STD testing.    BMI is There is no height or weight on file to calculate BMI., he has been working on diet and exercise. Exercises 5 days a week. He is trying to get down to 210 lb. He reports ~60 fluid ounces of water intake daily. He estimates 7 hours of sleep nightly. He drinks 3-4 cups of coffee daily.  He admits to 2-3 beers nightly.  Wt Readings from Last 3 Encounters:  12/22/18 202 lb 9.6 oz (91.9 kg)  06/21/18 221 lb 12.8 oz (100.6 kg)  05/02/18 226 lb (102.5 kg)   Today their BP is   He does workout. He denies chest pain, shortness of breath, dizziness.   He is not  on cholesterol medication and denies myalgias. His cholesterol is at goal. The cholesterol last visit was:   Lab Results  Component Value Date   CHOL 149 06/21/2018   HDL 58 06/21/2018   LDLCALC 77 06/21/2018   TRIG 60 06/21/2018   CHOLHDL 2.6 06/21/2018    Last A1C in the office was:  Lab Results  Component Value Date   HGBA1C 5.2 06/21/2018   Last GFR: Lab Results  Component Value Date   GFRNONAA 104 12/22/2018   Patient has vitamin D deficiency, now on supplement ***:   Lab Results  Component Value Date   VD25OH 22 (L) 06/21/2018     Last PSA was: Lab Results  Component Value Date   PSA 0.7 06/21/2018  He has ED; takes sildenafil PRN, gets through mail  order service, continues to work well ***  Lab Results  Component Value Date   WBC 5.4 12/22/2018   HGB 15.6 12/22/2018   HCT 44.2 12/22/2018   MCV 94.6 12/22/2018   PLT 253 12/22/2018     Lab Results  Component Value Date   IRON 141 05/21/2016   TIBC 337 05/21/2016    *** Lab Results  Component Value Date   VITAMINB12 283 06/21/2018     Current Medications:  Current Outpatient Medications on File Prior to Visit  Medication Sig Dispense Refill  . CHOLECALCIFEROL PO Take 1 tablet by mouth daily.    . Cyanocobalamin (B12 LIQUID HEALTH BOOSTER PO) Take 1 applicator by mouth daily.    . fluticasone (CUTIVATE) 0.05 % cream APPLY TOPICALLY TWO TIMES  DAILY AS NEEDED 180 g 3  . fluticasone (FLONASE) 50 MCG/ACT nasal spray 1 spray both nostrils in a.m. 48 g 3  . ipratropium (ATROVENT) 0.06 % nasal spray USE 2 SPRAYS NASALLY 3  TIMES DAILY 45 mL 3  . losartan (COZAAR) 50 MG tablet Take 1 tablet Daily for BP 90 tablet 0  . Methylcellulose, Laxative, (CITRUCEL PO) Take by mouth.    . sildenafil (VIAGRA) 50 MG tablet Take 50 mg daily as needed by mouth for erectile dysfunction.     No current facility-administered medications on file prior to visit.   Allergies:  Allergies  Allergen Reactions  . Hctz [Hydrochlorothiazide]     ED  . Morphine And Related Other (See Comments)    " PASSED OUT" PER PT   Health Maintenance:  Immunization History  Administered Date(s) Administered  . Influenza Inj Mdck Quad With Preservative 05/20/2017, 04/21/2018, 04/20/2019  . Influenza Split 05/10/2014  . Influenza, Seasonal, Injecte, Preservative Fre 05/22/2015  . Influenza,inj,quad, With Preservative 05/21/2016  . Influenza-Unspecified 05/06/2011, 04/18/2013  . PPD Test 05/10/2014  . Tdap 07/29/2011    Tetanus: 2013 Flu vaccine: 04/2019 Shingrix: declines  Colonoscopy: 2019 - polyp biopsied - due 5 years EGD: n/a   Eye Exam: several years ago - encouraged to follow up with  this Dentist: Dr. Gloriann Loan - q6 months - no issues Derm:   Patient Care Team: Unk Pinto, MD as PCP - General (Internal Medicine) Milus Banister, MD as Consulting Physician (Gastroenterology) Michael Boston, MD as Consulting Physician (General Surgery) Monna Fam, MD as Consulting Physician (Ophthalmology) Kathie Rhodes, MD as Consulting Physician (Urology) Suella Broad, MD as Consulting Physician (Physical Medicine and Rehabilitation) Druscilla Brownie, MD as Consulting Physician (Dermatology) Mosetta Anis, MD as Referring Physician (Allergy)  Medical History:  has IBS (irritable bowel syndrome) - mild w diarrhea; Hypertension; Environmental allergies; DDD (degenerative disc disease), lumbar; ED (erectile dysfunction);  Personal history of colonic polyps; Overweight (BMI 25.0-29.9); B12 deficiency; and Vitamin D deficiency on their problem list. 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 Family History:  His family history includes Cancer in his mother; Diabetes in his mother. Social History:   reports that he has been smoking cigars. He has smoked for the past 30.00 years. He has never used smokeless tobacco. He reports current alcohol use of about 14.0 standard drinks of alcohol per week. He reports that he does not use drugs. Review of Systems:  Review of Systems  Constitutional: Negative for chills, fever, malaise/fatigue and weight loss.  HENT: Negative for congestion, hearing loss, sinus pain, sore throat and tinnitus.   Eyes: Negative for blurred vision and double vision.  Respiratory: Negative for cough, sputum production, shortness of breath and wheezing.   Cardiovascular: Negative for chest pain, palpitations, orthopnea, claudication and leg swelling.  Gastrointestinal: Negative for abdominal pain, blood in  stool, constipation, diarrhea, heartburn, melena, nausea and vomiting.  Genitourinary: Negative.  Negative for dysuria, frequency, hematuria and urgency.  Musculoskeletal: Negative for back pain, joint pain and myalgias.  Skin: Negative for rash.  Neurological: Negative for dizziness, tingling, sensory change, weakness and headaches.  Endo/Heme/Allergies: Positive for environmental allergies. Negative for polydipsia.  Psychiatric/Behavioral: Negative.  Negative for depression and substance abuse. The patient is not nervous/anxious and does not have insomnia.   All other systems reviewed and are negative.   Physical Exam: Estimated body mass index is 27.48 kg/m as calculated from the following:   Height as of 12/22/18: 6' (1.829 m).   Weight as of 12/22/18: 202 lb 9.6 oz (91.9 kg). There were no vitals taken for this visit. General Appearance: Well nourished, in no apparent distress.  Eyes: PERRLA, EOMs, conjunctiva no swelling or erythema, normal fundi and vessels.  Sinuses: No Frontal/maxillary tenderness  ENT/Mouth: Ext aud canals clear, normal light reflex with TMs without erythema, bulging. Good dentition. No erythema, swelling, or exudate on post pharynx. Tonsils not swollen or erythematous. Hearing normal.  Neck: Supple, thyroid normal. No bruits  Respiratory: Respiratory effort normal, BS equal bilaterally without rales, rhonchi, wheezing or stridor.  Cardio: RRR without murmurs, rubs or gallops. Brisk peripheral pulses without edema.  Chest: symmetric, with normal excursions and percussion.  Abdomen: Soft, nontender, no guarding, rebound, hernias, masses, or organomegaly.  Lymphatics: Non tender without lymphadenopathy.  Genitourinary: Defer, no issues Musculoskeletal: Full ROM all peripheral extremities, 5/5 strength, and normal gait.  Skin: Warm, dry without rashes, lesions, ecchymosis. Neuro: Cranial nerves intact, reflexes equal bilaterally. Normal muscle tone, no cerebellar  symptoms. Sensation intact.  Psych: Awake and oriented X 3, normal affect, Insight and Judgment appropriate.   EKG: WNL no changes.  Gorden Harms [place] Jun 1:25 PM [facility]

## 2019-06-26 ENCOUNTER — Encounter: Payer: Self-pay | Admitting: Adult Health

## 2019-07-25 NOTE — Progress Notes (Signed)
Complete Physical  Assessment and Plan:  Encounter for general adult medical examination with abnormal findings 1 year  Essential hypertension Well controlled on current medication Monitor blood pressure at home; call if consistently over 130/80 Continue DASH diet.   Reminder to go to the ER if any CP, SOB, nausea, dizziness, severe HA, changes vision/speech, left arm numbness and tingling and jaw pain. -     Magnesium  Irritable bowel syndrome with diarrhea      If not on benefiber then add it, decrease stress,  if any worsening symptoms, blood in stool, AB pain, etc call office  Personal history of colonic polyps Just had in 2019, due 5 year follow up  DDD (degenerative disc disease), lumbar       -     Had back injections, has been stable for past 4 years after weight loss and with core strengthening  Erectile dysfunction, unspecified erectile dysfunction type        -    Sildenafil 50 mg working well -     Testosterone - has lost weight with dieting, fears he has lot muscle mass too. Increase protein, add weight lifting and will check for testosterone def, information on natural ways to increase testosterone given  Environmental allergies       -      Avoid triggers; well managed with current singulaire + PRN medications  Screening for thyroid disorder -     TSH  Screening for hyperlipidemia -     Lipid panel  Screening for diabetes mellitus -     Hemoglobin A1c  Screening for cardiovascular condition -     EKG 12-Lead  Screening for prostate cancer -     PSA  Screening for hematuria or proteinuria -     Urinalysis, Complete (81001)  Medication management -     CBC with Differential/Platelet -     CMP/GFR  Vitamin D deficiency -     VITAMIN D 25 Hydroxy (Vit-D Deficiency, Fractures) - emphasized that he need to get on vitamin D, to reduce cancer risk and especially during the pandemic to potentially decrease risk of worse covid outcomes.    Discussed  med's effects and SE's. Screening labs and tests as requested with regular follow-up as recommended. 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   His medical problems includes:  has IBS (irritable bowel syndrome) - mild w diarrhea; Hypertension; Environmental allergies; DDD (degenerative disc disease), lumbar; ED (erectile dysfunction); Personal history of colonic polyps; Overweight (BMI 25.0-29.9); B12 deficiency; and Vitamin D deficiency on their problem list.   Followed by Dr. Allyson Sabal (derm) - for rash/skin concerns and routine check ups. 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    BMI is Body mass index is 25.77 kg/m., he has been working on diet and exercise. Exercises 5 days a week. Has been doing [employer] soup 2 days a week and 1 meal a day. Has lost weight but is afraid he has lost muscle with it, has started to add in weights.  Wt Readings from Last 3 Encounters:  07/27/19 198 lb (89.8 kg)  12/22/18 202 lb  9.6 oz (91.9 kg)  06/21/18 221 lb 12.8 oz (100.6 kg)   Today their BP is BP: 124/80 He does workout. He denies chest pain, shortness of breath, dizziness.   He is not on cholesterol medication and denies myalgias. His cholesterol is at goal. The cholesterol last visit was:   Lab Results  Component Value Date   CHOL 149 06/21/2018   HDL 58 06/21/2018   LDLCALC 77 06/21/2018   TRIG 60 06/21/2018   CHOLHDL 2.6 06/21/2018    Last A1C in the office was:  Lab Results  Component Value Date   HGBA1C 5.2 06/21/2018   Last GFR: Lab Results  Component Value Date   GFRNONAA 104 12/22/2018   Patient is not  on Vitamin D supplement and low at last visit:   Lab Results  Component Value Date   VD25OH 22 (L) 06/21/2018     Last PSA was: Lab Results  Component Value Date   PSA 0.7 06/21/2018    Current Medications:  Current Outpatient Medications on File Prior to Visit  Medication Sig Dispense Refill  . CHOLECALCIFEROL PO Take 1 tablet by mouth daily.    . Cyanocobalamin (B12 LIQUID HEALTH BOOSTER PO) Take 1 applicator by mouth daily.    . fluticasone (CUTIVATE) 0.05 % cream APPLY TOPICALLY TWO TIMES  DAILY AS NEEDED 180 g 3  . fluticasone (FLONASE) 50 MCG/ACT nasal spray 1 spray both nostrils in a.m. 48 g 3  . ipratropium (ATROVENT) 0.06 % nasal spray USE 2 SPRAYS NASALLY 3  TIMES DAILY 45 mL 3  . losartan (COZAAR) 50 MG tablet Take 1 tablet Daily for BP 90 tablet 0  . Methylcellulose, Laxative, (CITRUCEL PO) Take by mouth.    . sildenafil (VIAGRA) 50 MG tablet Take 50 mg daily as needed by mouth for erectile dysfunction.     No current facility-administered medications on file prior to visit.   Allergies:  Allergies  Allergen Reactions  . Hctz [Hydrochlorothiazide]     ED  . Morphine And Related Other (See Comments)    " PASSED OUT" PER PT   Health Maintenance:  Immunization History  Administered Date(s) Administered  . Influenza Inj Mdck Quad With Preservative 05/20/2017, 04/21/2018, 04/20/2019  . Influenza Split 05/10/2014  . Influenza, Seasonal, Injecte, Preservative Fre 05/22/2015  . Influenza,inj,quad, With Preservative 05/21/2016  . Influenza-Unspecified 05/06/2011, 04/18/2013  . PPD Test 05/10/2014  . Tdap 07/29/2011    Tetanus: 2013 Flu vaccine: 2020 Shingrix: declines  Colonoscopy: 2019 - polyp biopsied - due 5 years EGD: n/a   Eye Exam: has been years Dentist: Dr. Gloriann Loan - q6 months - no issues Derm:   Patient Care Team: Unk Pinto, MD as PCP - General (Internal Medicine) Milus Banister, MD as Consulting Physician (Gastroenterology) Michael Boston,  MD as Consulting Physician (General Surgery) Monna Fam, MD as Consulting Physician (Ophthalmology) Kathie Rhodes, MD as Consulting Physician (Urology) Suella Broad, MD as Consulting Physician (Physical Medicine and Rehabilitation) Druscilla Brownie, MD as Consulting Physician (Dermatology) Mosetta Anis, MD as Referring Physician (Allergy)  Medical History:  has IBS (irritable bowel syndrome) - mild w diarrhea; Hypertension; Environmental allergies; DDD (degenerative disc disease), lumbar; ED (erectile dysfunction); Personal history of colonic polyps; Overweight (BMI 25.0-29.9); B12 deficiency; and Vitamin D deficiency on their problem list. 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 Family History:  His  family history includes Cancer in his mother; Diabetes in his mother. Social History:   reports that he has been smoking cigars. He has smoked for the past 30.00 years. He has never used smokeless tobacco. He reports current alcohol use of about 14.0 standard drinks of alcohol per week. He reports that he does not use drugs. Beer 1-2 at night Review of Systems:  Review of Systems  Constitutional: Negative for chills, fever, malaise/fatigue and weight loss.  HENT: Negative for congestion, hearing loss, sinus pain, sore throat and tinnitus.   Eyes: Negative for blurred vision and double vision.  Respiratory: Negative for cough, sputum production, shortness of breath and wheezing.   Cardiovascular: Negative for chest pain, palpitations, orthopnea, claudication and leg swelling.  Gastrointestinal: Negative for abdominal pain, blood in stool, constipation, diarrhea, heartburn, melena, nausea and vomiting.  Genitourinary: Negative.  Negative for dysuria, frequency, hematuria and urgency.  Musculoskeletal: Negative for back pain,  joint pain and myalgias.  Skin: Negative for rash.  Neurological: Negative for dizziness, tingling, sensory change, weakness and headaches.  Endo/Heme/Allergies: Positive for environmental allergies. Negative for polydipsia.  Psychiatric/Behavioral: Negative.  Negative for depression and substance abuse. The patient is not nervous/anxious and does not have insomnia.   All other systems reviewed and are negative.   Physical Exam: Estimated body mass index is 25.77 kg/m as calculated from the following:   Height as of this encounter: 6' 1.5" (1.867 m).   Weight as of this encounter: 198 lb (89.8 kg). BP 124/80   Pulse 71   Temp (!) 97.5 F (36.4 C)   Ht 6' 1.5" (1.867 m)   Wt 198 lb (89.8 kg)   SpO2 99%   BMI 25.77 kg/m  General Appearance: Well nourished, in no apparent distress.  Eyes: PERRLA, EOMs, conjunctiva no swelling or erythema, normal fundi and vessels.  Sinuses: No Frontal/maxillary tenderness  ENT/Mouth: Ext aud canals clear, normal light reflex with TMs without erythema, bulging. Good dentition. No erythema, swelling, or exudate on post pharynx. Tonsils not swollen or erythematous. Hearing normal.  Neck: Supple, thyroid normal. No bruits  Respiratory: Respiratory effort normal, BS equal bilaterally without rales, rhonchi, wheezing or stridor.  Cardio: RRR without murmurs, rubs or gallops. Brisk peripheral pulses without edema.  Chest: symmetric, with normal excursions and percussion.  Abdomen: Soft, nontender, no guarding, rebound, hernias, masses, or organomegaly.  Lymphatics: Non tender without lymphadenopathy.  Genitourinary: Defer, no issues Musculoskeletal: Full ROM all peripheral extremities, 5/5 strength, and normal gait.  Skin: Warm, dry without rashes, lesions, ecchymosis. Neuro: Cranial nerves intact, reflexes equal bilaterally. Normal muscle tone, no cerebellar symptoms. Sensation intact.  Psych: Awake and oriented X 3, normal affect, Insight and Judgment  appropriate.   EKG: WNL no changes.  Vicie Mutters 9:24 AM [facility]

## 2019-07-27 ENCOUNTER — Encounter: Payer: Self-pay | Admitting: Physician Assistant

## 2019-07-27 ENCOUNTER — Ambulatory Visit: Payer: 59 | Admitting: Physician Assistant

## 2019-07-27 ENCOUNTER — Other Ambulatory Visit: Payer: Self-pay

## 2019-07-27 VITALS — BP 124/80 | HR 71 | Temp 97.5°F | Ht 73.5 in | Wt 198.0 lb

## 2019-07-27 DIAGNOSIS — Z136 Encounter for screening for cardiovascular disorders: Secondary | ICD-10-CM | POA: Diagnosis not present

## 2019-07-27 DIAGNOSIS — E663 Overweight: Secondary | ICD-10-CM

## 2019-07-27 DIAGNOSIS — Z13220 Encounter for screening for lipoid disorders: Secondary | ICD-10-CM

## 2019-07-27 DIAGNOSIS — Z9109 Other allergy status, other than to drugs and biological substances: Secondary | ICD-10-CM

## 2019-07-27 DIAGNOSIS — E559 Vitamin D deficiency, unspecified: Secondary | ICD-10-CM

## 2019-07-27 DIAGNOSIS — E538 Deficiency of other specified B group vitamins: Secondary | ICD-10-CM

## 2019-07-27 DIAGNOSIS — Z125 Encounter for screening for malignant neoplasm of prostate: Secondary | ICD-10-CM

## 2019-07-27 DIAGNOSIS — Z0000 Encounter for general adult medical examination without abnormal findings: Secondary | ICD-10-CM | POA: Diagnosis not present

## 2019-07-27 DIAGNOSIS — N529 Male erectile dysfunction, unspecified: Secondary | ICD-10-CM

## 2019-07-27 DIAGNOSIS — Z0001 Encounter for general adult medical examination with abnormal findings: Secondary | ICD-10-CM

## 2019-07-27 DIAGNOSIS — Z79899 Other long term (current) drug therapy: Secondary | ICD-10-CM

## 2019-07-27 DIAGNOSIS — Z86010 Personal history of colonic polyps: Secondary | ICD-10-CM

## 2019-07-27 DIAGNOSIS — I10 Essential (primary) hypertension: Secondary | ICD-10-CM | POA: Diagnosis not present

## 2019-07-27 DIAGNOSIS — Z131 Encounter for screening for diabetes mellitus: Secondary | ICD-10-CM

## 2019-07-27 DIAGNOSIS — M5136 Other intervertebral disc degeneration, lumbar region: Secondary | ICD-10-CM

## 2019-07-27 DIAGNOSIS — Z1329 Encounter for screening for other suspected endocrine disorder: Secondary | ICD-10-CM

## 2019-07-27 DIAGNOSIS — K580 Irritable bowel syndrome with diarrhea: Secondary | ICD-10-CM

## 2019-07-27 NOTE — Patient Instructions (Addendum)
VITAMIN D IS IMPORTANT  Vitamin D goal is between 60-80  Please make sure that you are taking your Vitamin D as directed.   It is very important as a natural anti-inflammatory   helping hair, skin, and nails, as well as reducing stroke and heart attack risk.   It helps your bones and helps with mood.  We want you on at least 5000 IU daily  It also decreases numerous cancer risks so please take it as directed.   Low Vit D is associated with a 200-300% higher risk for CANCER   and 200-300% higher risk for HEART   ATTACK  &  STROKE.    .....................................Marland Kitchen  It is also associated with higher death rate at younger ages,   autoimmune diseases like Rheumatoid arthritis, Lupus, Multiple Sclerosis.     Also many other serious conditions, like depression, Alzheimer's  Dementia, infertility, muscle aches, fatigue, fibromyalgia - just to name a few.  +++++++++++++++++++  Can get liquid vitamin D from [facility] here in [place] at  [facility] alternatives [address] [place], [place] [address] Or you can try earth fare   Can do zinc 40-50 mg a day Can try shake that is whey protein, almond milk, avocado oil, and spinach and strawberries in the morning  9 Ways to Naturally Increase Testosterone Levels  1.   Lose Weight If you're overweight, shedding the excess pounds may increase your testosterone levels, according to research presented at the Endocrine Society's 2012 meeting. Overweight men are more likely to have low testosterone levels to begin with, so this is an important trick to increase your body's testosterone production when you need it most.  2.   High-Intensity Exercise like Peak Fitness  Short intense exercise has a proven positive effect on increasing testosterone levels and preventing its decline. That's unlike aerobics or prolonged moderate exercise, which have shown to have negative or no effect on testosterone levels. Having a whey protein meal  after exercise can further enhance the satiety/testosterone-boosting impact (hunger hormones cause the opposite effect on your testosterone and libido). Here's a summary of what a typical high-intensity Peak Fitness routine might look like: " Warm up for three minutes  " Exercise as hard and fast as you can for 30 seconds. You should feel like you couldn't possibly go on another few seconds  " Recover at a slow to moderate pace for 90 seconds  " Repeat the high intensity exercise and recovery 7 more times .  3.   Consume Plenty of Zinc The mineral zinc is important for testosterone production, and supplementing your diet for as little as six weeks has been shown to cause a marked improvement in testosterone among men with low levels.1 Likewise, research has shown that restricting dietary sources of zinc leads to a significant decrease in testosterone, while zinc supplementation increases it2 -- and even protects men from exercised-induced reductions in testosterone levels.3 It's estimated that up to 24 percent of adults over the age of 60 may have lower than recommended zinc intakes; even when dietary supplements were added in, an estimated 20-25 percent of older adults still had inadequate zinc intakes, according to a [employer] and Nutrition Examination Survey.4 Your diet is the best source of zinc; along with protein-rich foods like meats and fish, other good dietary sources of zinc include raw milk, raw cheese, beans, and yogurt or kefir made from raw milk. It can be difficult to obtain enough dietary zinc if you're a vegetarian,  and also for meat-eaters as well, largely because of conventional farming methods that rely heavily on chemical fertilizers and pesticides. These chemicals deplete the soil of nutrients ... nutrients like zinc that must be absorbed by plants in order to be passed on to you. In many cases, you may further deplete the nutrients in your food by the way you prepare it. For  most food, cooking it will drastically reduce its levels of nutrients like zinc ... particularly over-cooking, which many people do. If you decide to use a zinc supplement, stick to a dosage of less than 40 mg a day, as this is the recommended adult upper limit. Taking too much zinc can interfere with your body's ability to absorb other minerals, especially copper, and may cause nausea as a side effect.  4.   Strength Training In addition to Peak Fitness, strength training is also known to boost testosterone levels, provided you are doing so intensely enough. When strength training to boost testosterone, you'll want to increase the weight and lower your number of reps, and then focus on exercises that work a large number of muscles, such as dead lifts or squats.  You can "turbo-charge" your weight training by going slower. By slowing down your movement, you're actually turning it into a high-intensity exercise. Super Slow movement allows your muscle, at the microscopic level, to access the maximum number of cross-bridges between the protein filaments that produce movement in the muscle.   5.   Optimize Your Vitamin D Levels Vitamin D, a steroid hormone, is essential for the healthy development of the nucleus of the sperm cell, and helps maintain semen quality and sperm count. Vitamin D also increases levels of testosterone, which may boost libido. In one study, overweight men who were given vitamin D supplements had a significant increase in testosterone levels after one year.5   6.   Reduce Stress When you're under a lot of stress, your body releases high levels of the stress hormone cortisol. This hormone actually blocks the effects of testosterone,6 presumably because, from a biological standpoint, testosterone-associated behaviors (mating, competing, aggression) may have lowered your chances of survival in an emergency (hence, the "fight or flight" response is dominant, courtesy of cortisol).  7.    Limit or Eliminate Sugar from Your Diet Testosterone levels decrease after you eat sugar, which is likely because the sugar leads to a high insulin level, another factor leading to low testosterone.7 Based on USDA estimates, the average American consumes 12 teaspoons of sugar a day, which equates to about TWO TONS of sugar during a lifetime.  8.   Eat Healthy Fats By healthy, this means not only mon- and polyunsaturated fats, like that found in avocadoes and nuts, but also saturated, as these are essential for building testosterone. Research shows that a diet with less than 40 percent of energy as fat (and that mainly from animal sources, i.e. saturated) lead to a decrease in testosterone levels.8 My personal diet is about 60-70 percent healthy fat, and other experts agree that the ideal diet includes somewhere between 50-70 percent fat.  It's important to understand that your body requires saturated fats from animal and vegetable sources (such as meat, dairy, certain oils, and tropical plants like coconut) for optimal functioning, and if you neglect this important food group in favor of sugar, grains and other starchy carbs, your health and weight are almost guaranteed to suffer. Examples of healthy fats you can eat more of to give your testosterone levels a  boost include: Olives and Olive oil  Coconuts and coconut oil Butter made from raw grass-fed organic milk Raw nuts, such as, almonds or pecans Organic pastured egg yolks Avocados Grass-fed meats Palm oil Unheated organic nut oils   9.   Boost Your Intake of Branch Chain Amino Acids (BCAA) from Foods Like [place] suggests that BCAAs result in higher testosterone levels, particularly when taken along with resistance training.9 While BCAAs are available in supplement form, you'll find the highest concentrations of BCAAs like leucine in dairy products - especially quality cheeses and whey protein. Even when getting leucine from your  natural food supply, it's often wasted or used as a building block instead of an anabolic agent. So to create the correct anabolic environment, you need to boost leucine consumption way beyond mere maintenance levels. That said, keep in mind that using leucine as a free form amino acid can be highly counterproductive as when free form amino acids are artificially administrated, they rapidly enter your circulation while disrupting insulin function, and impairing your body's glycemic control. Food-based leucine is really the ideal form that can benefit your muscles without side effects.    [email or web address]

## 2019-07-28 LAB — MICROALBUMIN / CREATININE URINE RATIO
Creatinine, Urine: 34 mg/dL (ref 20–320)
Microalb, Ur: <0.2 mg/dL

## 2019-07-28 LAB — URINALYSIS, ROUTINE W REFLEX MICROSCOPIC
Bilirubin Urine: NEGATIVE
Glucose, UA: NEGATIVE
Hgb urine dipstick: NEGATIVE
Ketones, ur: NEGATIVE
Leukocytes,Ua: NEGATIVE
Nitrite: NEGATIVE
Protein, ur: NEGATIVE
Specific Gravity, Urine: 1.006 (ref 1.001–1.03)
pH: 7 (ref 5.0–8.0)

## 2019-07-28 LAB — VITAMIN D 25 HYDROXY (VIT D DEFICIENCY, FRACTURES): Vit D, 25-Hydroxy: 25 ng/mL — ABNORMAL LOW (ref 30–100)

## 2019-07-28 LAB — COMPLETE METABOLIC PANEL WITH GFR
AG Ratio: 1.8 (calc) (ref 1.0–2.5)
ALT: 11 U/L (ref 9–46)
AST: 17 U/L (ref 10–35)
Albumin: 4.5 g/dL (ref 3.6–5.1)
Alkaline phosphatase (APISO): 47 U/L (ref 35–144)
BUN: 9 mg/dL (ref 7–25)
CO2: 30 mmol/L (ref 20–32)
Calcium: 9.9 mg/dL (ref 8.6–10.3)
Chloride: 103 mmol/L (ref 98–110)
Creat: 0.7 mg/dL (ref 0.70–1.33)
GFR, Est African American: 121 mL/min/{1.73_m2} (ref >=60)
GFR, Est Non African American: 104 mL/min/{1.73_m2} (ref >=60)
Globulin: 2.5 g/dL (calc) (ref 1.9–3.7)
Glucose, Bld: 92 mg/dL (ref 65–99)
Potassium: 4.2 mmol/L (ref 3.5–5.3)
Sodium: 139 mmol/L (ref 135–146)
Total Bilirubin: 0.6 mg/dL (ref 0.2–1.2)
Total Protein: 7 g/dL (ref 6.1–8.1)

## 2019-07-28 LAB — CBC WITH DIFFERENTIAL/PLATELET
Absolute Monocytes: 446 cells/uL (ref 200–950)
Basophils Absolute: 41 cells/uL (ref 0–200)
Basophils Relative: 0.9 %
Eosinophils Absolute: 161 cells/uL (ref 15–500)
Eosinophils Relative: 3.5 %
HCT: 43.7 % (ref 38.5–50.0)
Hemoglobin: 15.3 g/dL (ref 13.2–17.1)
Lymphs Abs: 1504 cells/uL (ref 850–3900)
MCH: 33.3 pg — ABNORMAL HIGH (ref 27.0–33.0)
MCHC: 35 g/dL (ref 32.0–36.0)
MCV: 95 fL (ref 80.0–100.0)
MPV: 9.6 fL (ref 7.5–12.5)
Monocytes Relative: 9.7 %
Neutro Abs: 2447 cells/uL (ref 1500–7800)
Neutrophils Relative %: 53.2 %
Platelets: 225 10*3/uL (ref 140–400)
RBC: 4.6 10*6/uL (ref 4.20–5.80)
RDW: 11.5 % (ref 11.0–15.0)
Total Lymphocyte: 32.7 %
WBC: 4.6 10*3/uL (ref 3.8–10.8)

## 2019-07-28 LAB — PSA: PSA: 0.6 ng/mL (ref <=4.0)

## 2019-07-28 LAB — LIPID PANEL
Cholesterol: 153 mg/dL (ref <200)
HDL: 62 mg/dL (ref >=40)
LDL Cholesterol (Calc): 78 mg/dL (calc)
Non-HDL Cholesterol (Calc): 91 mg/dL (calc) (ref <130)
Total CHOL/HDL Ratio: 2.5 (calc) (ref <5.0)
Triglycerides: 47 mg/dL (ref <150)

## 2019-07-28 LAB — TSH: TSH: 1.66 mIU/L (ref 0.40–4.50)

## 2019-07-28 LAB — TESTOSTERONE: Testosterone: 262 ng/dL (ref 250–827)

## 2019-07-28 LAB — HEMOGLOBIN A1C
Hgb A1c MFr Bld: 4.9 % of total Hgb (ref <5.7)
Mean Plasma Glucose: 94 (calc)
eAG (mmol/L): 5.2 (calc)

## 2019-07-28 LAB — MAGNESIUM: Magnesium: 2 mg/dL (ref 1.5–2.5)

## 2019-08-09 ENCOUNTER — Other Ambulatory Visit: Payer: Self-pay | Admitting: Internal Medicine

## 2019-08-09 DIAGNOSIS — E538 Deficiency of other specified B group vitamins: Secondary | ICD-10-CM

## 2019-09-12 ENCOUNTER — Other Ambulatory Visit: Payer: Self-pay | Admitting: Adult Health

## 2019-10-20 ENCOUNTER — Other Ambulatory Visit: Payer: Self-pay | Admitting: Internal Medicine

## 2019-10-20 DIAGNOSIS — I10 Essential (primary) hypertension: Secondary | ICD-10-CM

## 2019-12-06 ENCOUNTER — Other Ambulatory Visit: Payer: Self-pay | Admitting: Internal Medicine

## 2020-01-09 ENCOUNTER — Other Ambulatory Visit: Payer: Self-pay | Admitting: Internal Medicine

## 2020-01-09 DIAGNOSIS — I10 Essential (primary) hypertension: Secondary | ICD-10-CM

## 2020-03-06 ENCOUNTER — Other Ambulatory Visit: Payer: Self-pay

## 2020-03-06 ENCOUNTER — Encounter: Payer: Self-pay | Admitting: Physician Assistant

## 2020-03-06 ENCOUNTER — Ambulatory Visit: Payer: 59 | Admitting: Physician Assistant

## 2020-03-06 VITALS — BP 124/80 | HR 74 | Temp 98.1°F | Ht 73.5 in | Wt 197.2 lb

## 2020-03-06 DIAGNOSIS — H9201 Otalgia, right ear: Secondary | ICD-10-CM | POA: Diagnosis not present

## 2020-03-06 DIAGNOSIS — E559 Vitamin D deficiency, unspecified: Secondary | ICD-10-CM

## 2020-03-06 DIAGNOSIS — E538 Deficiency of other specified B group vitamins: Secondary | ICD-10-CM | POA: Diagnosis not present

## 2020-03-06 DIAGNOSIS — I10 Essential (primary) hypertension: Secondary | ICD-10-CM

## 2020-03-06 MED ORDER — HYDROCORTISONE-ACETIC ACID 1-2 % OT SOLN: 3.0000 [drp] | Freq: Two times a day (BID) | OTIC | 1 refills | Status: DC

## 2020-03-06 MED ORDER — CIPROFLOXACIN HCL 0.2 % OT SOLN: 0.2000 mL | Freq: Two times a day (BID) | OTIC | 0 refills | Status: DC

## 2020-03-06 NOTE — Progress Notes (Signed)
FOLLOW UP  Assessment and Plan:   Hypertension Well controlled with current medications  Monitor blood pressure at home; patient to call if consistently greater than 130/80 Continue DASH diet.   Reminder to go to the ER if any CP, SOB, nausea, dizziness, severe HA, changes vision/speech, left arm numbness and tingling and jaw pain.  Overweight Long discussion about weight loss, diet, and exercise Recommended diet heavy in fruits and veggies and low in animal meats, cheeses, and dairy products, appropriate calorie intake Discussed ideal weight for height - he has reached weight goal, discussed strategies to maintain Will follow up in 6 months  Irritable bowel syndrome with diarrhea       -     Takes citrucel daily; symptoms stable  - with occasional loose stool      -     unknown if have had celiac panel but patient states has seen GI in the past   Vitamin D deficiency Continue supplementation for goal of 60-100 Check vitamin D level  B12 deficiency -     Vitamin B12  Right ear pain -     acetic acid-hydrocortisone (VOSOL-HC) OTIC solution; Place 3 drops into the right ear 2 (two) times daily. - ear irrigated in the office- ? Possible fungus=- will give external drops- keep OV with ENT   Continue diet and meds as discussed. Further disposition pending results of labs. Discussed med's effects and SE's.   Over 15 minutes of exam, counseling, chart review, and critical decision making was performed.   Future Appointments  Date Time Provider [place]  07/29/2020  9:00 AM Vicie Mutters, PA-C [id number] None    ----------------------------------------------------------------------------------------------------------------------  HPI 59 y.o. male  presents for 6 month follow up on hypertension, weight and vitamin D deficiency.   He has mild IBS-D improved with daily citrucel supplement.   He has history of ear infections, [place] and  forgot to plug his ears, since that time he continues to have pain.   BMI is Body mass index is 25.66 kg/m., he has been working on diet, but works out 5 days a week, and has reached initial goal os <210lb.   Wt Readings from Last 3 Encounters:  03/06/20 197 lb 3.2 oz (89.4 kg)  07/27/19 198 lb (89.8 kg)  12/22/18 202 lb 9.6 oz (91.9 kg)   His blood pressure has been controlled at home, today their BP is BP: 124/80  He does workout. He denies chest pain, shortness of breath, dizziness.  The cholesterol last visit was:   Lab Results  Component Value Date   CHOL 153 07/27/2019   HDL 62 07/27/2019   LDLCALC 78 07/27/2019   TRIG 47 07/27/2019   CHOLHDL 2.5 07/27/2019   Last A1C in the office was:  Lab Results  Component Value Date   HGBA1C 4.9 07/27/2019   Patient has NOT initiated Vitamin D supplement.   Lab Results  Component Value Date   VD25OH 25 (L) 07/27/2019     He has started liquid b12 supplement daily  Lab Results  Component Value Date   VITAMINB12 283 06/21/2018      Current Medications:  Current Outpatient Medications on File Prior to Visit  Medication Sig   cetirizine-pseudoephedrine (ALL DAY ALLERGY-D) 5-120 MG tablet Take 1 tablet 2 x /day for Allergies & Congestion   CHOLECALCIFEROL PO Take 1 tablet by mouth daily.   Cyanocobalamin (B12 LIQUID HEALTH BOOSTER PO) Take 1 applicator  by mouth daily.   fluticasone (CUTIVATE) 0.05 % cream APPLY TOPICALLY TWO TIMES  DAILY AS NEEDED   fluticasone (FLONASE) 50 MCG/ACT nasal spray USE 1 SPRAY IN EACH NOSTRIL EVERY MORNING   ipratropium (ATROVENT) 0.06 % nasal spray USE 1 TO 2 SPRAYS IN BOTH  NOSTRILS 2 TO 3 TIMES DAILY   losartan (COZAAR) 50 MG tablet Take 1 tablet Daily for BP   Methylcellulose, Laxative, (CITRUCEL PO) Take by mouth.   sildenafil (VIAGRA) 50 MG tablet Take 50 mg daily as needed by mouth for erectile dysfunction.   No current facility-administered medications on file prior to visit.      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  HENT: Positive for ear pain. Negative for ear discharge, hearing loss and tinnitus.   Eyes: Negative for blurred vision and double vision.  Respiratory: Negative for cough, shortness of breath and wheezing.   Cardiovascular: Negative for chest pain, palpitations, orthopnea, claudication and leg swelling.  Gastrointestinal: Negative for abdominal pain, blood in stool, constipation, diarrhea, heartburn, melena, nausea and vomiting.  Genitourinary: Negative.   Musculoskeletal: Negative for joint pain and myalgias.  Skin: Negative for rash.  Neurological: Negative for dizziness, tingling, sensory change, weakness and headaches.  Endo/Heme/Allergies: Negative for polydipsia.  Psychiatric/Behavioral: Negative.   All other systems reviewed and are negative.     Physical Exam: BP 124/80    Pulse 74    Temp 98.1 F (36.7 C)    Ht 6' 1.5" (1.867 m)    Wt 197 lb 3.2 oz (89.4 kg)    SpO2 99%    BMI 25.66 kg/m  Wt Readings from Last 3 Encounters:  03/06/20 197 lb 3.2 oz (89.4 kg)  07/27/19 198 lb (89.8 kg)  12/22/18 202 lb 9.6 oz (91.9 kg)   General Appearance: Well nourished, in no apparent distress. Eyes: PERRLA, EOMs, conjunctiva no swelling or erythema Sinuses: No  Frontal/maxillary tenderness ENT/Mouth: Ext aud canals clear, TMs without erythema, bulging on left ear, right ear with no ext aud canal swelling but with white discharge in ear, washed out with normal TM without swelling/erythema.. No erythema, swelling, or exudate on post pharynx.  Tonsils not swollen or erythematous. Hearing normal.  Neck: Supple, thyroid normal.  Respiratory: Respiratory effort normal, BS equal bilaterally without rales, rhonchi, wheezing or stridor.  Cardio: RRR with no MRGs. Brisk peripheral pulses without edema.  Abdomen: Soft, + BS.  Non tender, no guarding, rebound, hernias, masses. Lymphatics: Non tender without lymphadenopathy.  Musculoskeletal: Full ROM, 5/5 strength, Normal gait Skin: Warm, dry without rashes, lesions, ecchymosis.  Neuro: Cranial nerves intact. No cerebellar symptoms.  Psych: Awake and oriented X 3, normal affect, Insight and Judgment appropriate.    Vicie Mutters, PA-C  11:22 AM [facility]

## 2020-03-06 NOTE — Addendum Note (Signed)
Addended by: Vicie Mutters R on: 03/06/2020 04:47 PM   Modules accepted: Orders

## 2020-03-07 ENCOUNTER — Other Ambulatory Visit: Payer: Self-pay | Admitting: Physician Assistant

## 2020-03-07 LAB — CBC WITH DIFFERENTIAL/PLATELET
Absolute Monocytes: 499 cells/uL (ref 200–950)
Basophils Absolute: 42 cells/uL (ref 0–200)
Basophils Relative: 0.8 %
Eosinophils Absolute: 130 cells/uL (ref 15–500)
Eosinophils Relative: 2.5 %
HCT: 43.9 % (ref 38.5–50.0)
Hemoglobin: 15.4 g/dL (ref 13.2–17.1)
Lymphs Abs: 1456 cells/uL (ref 850–3900)
MCH: 33.4 pg — ABNORMAL HIGH (ref 27.0–33.0)
MCHC: 35.1 g/dL (ref 32.0–36.0)
MCV: 95.2 fL (ref 80.0–100.0)
MPV: 10 fL (ref 7.5–12.5)
Monocytes Relative: 9.6 %
Neutro Abs: 3073 cells/uL (ref 1500–7800)
Neutrophils Relative %: 59.1 %
Platelets: 240 10*3/uL (ref 140–400)
RBC: 4.61 10*6/uL (ref 4.20–5.80)
RDW: 11.3 % (ref 11.0–15.0)
Total Lymphocyte: 28 %
WBC: 5.2 10*3/uL (ref 3.8–10.8)

## 2020-03-07 LAB — COMPLETE METABOLIC PANEL WITH GFR
AG Ratio: 2 (calc) (ref 1.0–2.5)
ALT: 10 U/L (ref 9–46)
AST: 16 U/L (ref 10–35)
Albumin: 4.8 g/dL (ref 3.6–5.1)
Alkaline phosphatase (APISO): 49 U/L (ref 35–144)
BUN: 9 mg/dL (ref 7–25)
CO2: 30 mmol/L (ref 20–32)
Calcium: 9.6 mg/dL (ref 8.6–10.3)
Chloride: 101 mmol/L (ref 98–110)
Creat: 0.83 mg/dL (ref 0.70–1.33)
GFR, Est African American: 112 mL/min/{1.73_m2} (ref >=60)
GFR, Est Non African American: 97 mL/min/{1.73_m2} (ref >=60)
Globulin: 2.4 g/dL (calc) (ref 1.9–3.7)
Glucose, Bld: 109 mg/dL — ABNORMAL HIGH (ref 65–99)
Potassium: 4 mmol/L (ref 3.5–5.3)
Sodium: 137 mmol/L (ref 135–146)
Total Bilirubin: 1 mg/dL (ref 0.2–1.2)
Total Protein: 7.2 g/dL (ref 6.1–8.1)

## 2020-03-07 LAB — VITAMIN D 25 HYDROXY (VIT D DEFICIENCY, FRACTURES): Vit D, 25-Hydroxy: 43 ng/mL (ref 30–100)

## 2020-03-07 LAB — VITAMIN B12: Vitamin B-12: 433 pg/mL (ref 200–1100)

## 2020-03-07 MED ORDER — CIPROFLOXACIN HCL 0.2 % OT SOLN: 0.2000 mL | Freq: Two times a day (BID) | OTIC | 0 refills | Status: AC

## 2020-03-07 MED ORDER — CIPROFLOXACIN HCL 0.2 % OT SOLN: 0.2000 mL | Freq: Two times a day (BID) | OTIC | 0 refills | Status: DC

## 2020-05-04 ENCOUNTER — Other Ambulatory Visit: Payer: Self-pay | Admitting: Internal Medicine

## 2020-06-25 ENCOUNTER — Encounter: Payer: 59 | Admitting: Adult Health

## 2020-07-16 ENCOUNTER — Other Ambulatory Visit: Payer: Self-pay

## 2020-07-16 DIAGNOSIS — I10 Essential (primary) hypertension: Secondary | ICD-10-CM

## 2020-07-25 ENCOUNTER — Other Ambulatory Visit: Payer: Self-pay

## 2020-07-29 ENCOUNTER — Encounter: Payer: 59 | Admitting: Adult Health

## 2020-07-30 ENCOUNTER — Other Ambulatory Visit: Payer: Self-pay | Admitting: Internal Medicine

## 2020-07-30 DIAGNOSIS — I10 Essential (primary) hypertension: Secondary | ICD-10-CM

## 2020-07-30 MED ORDER — LOSARTAN POTASSIUM 50 MG PO TABS: ORAL_TABLET | ORAL | 0 refills | Status: DC

## 2020-08-27 NOTE — Progress Notes (Signed)
Complete Physical  Assessment and Plan:  Encounter for general adult medical examination with abnormal findings 1 year  Essential hypertension Well controlled on current medication Monitor blood pressure at home; call if consistently over 130/80 Continue DASH diet.   Reminder to go to the ER if any CP, SOB, nausea, dizziness, severe HA, changes vision/speech, left arm numbness and tingling and jaw pain. -     Magnesium  Irritable bowel syndrome with diarrhea      If not on benefiber then add it, decrease stress,  if any worsening symptoms, blood in stool, AB pain, etc call office   Personal history of colonic polyps Had in 2019, due 5 year follow up  DDD (degenerative disc disease), lumbar       -     Had back injections, has been stable for past 4 years after weight loss and with core strengthening  Erectile dysfunction, unspecified erectile dysfunction type        -    Sildenafil 50 mg working well  Environmental allergies       -      Avoid triggers; well managed with current singulaire + PRN medications  Screening for thyroid disorder -     TSH  Screening for hyperlipidemia -     Lipid panel - defer this year, high deductible insurance  Screening for diabetes mellitus -     Hemoglobin A1c  Screening for cardiovascular condition -     EKG 12-Lead  Screening for prostate cancer -     PSA  Screening for hematuria or proteinuria -     Urinalysis, Complete (81001)  Medication management -     CBC with Differential/Platelet -     CMP/GFR  Vitamin D deficiency -     VITAMIN D 25 Hydroxy (Vit-D Deficiency, Fractures)   Discussed med's effects and SE's. Screening labs and tests as requested with regular follow-up as recommended. Over 40 minutes of exam, counseling, chart review and critical decision making was performed  Future Appointments  Date Time Provider R[place]  08/28/2021  9:00 AM Walter Black [facility] None    HPI 60 y.o. Male  Walter, presents for a complete physical. He has IBS (irritable bowel syndrome) - mild w diarrhea; Hypertension; Environmental allergies; DDD (degenerative disc disease), lumbar; ED (erectile dysfunction); Personal history of colonic polyps; Overweight (BMI 25.0-29.9); B12 deficiency; Vitamin D deficiency; and Cigar smoker on their problem list.   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 No concerns today, declines STD testing.    Followed by Walter Black (derm) - for rash/skin concerns and routine check ups.   BMI is Body mass index is 27.18 kg/m., he has been working on diet and exercise.  Exercises 5 days a week. Has been doing [employer] soup 2 days a week and 1 meal a day, but admits eating more overall. He would like to get more weights incorporated.  Wt Readings from Last 3 Encounters:  08/28/20 206 lb (93.4 kg)  03/06/20 197 lb 3.2 oz (89.4 kg)  07/27/19 198 lb (89.8 kg)   He doesn't check BPs at home, does have a cuff. Today their BP is BP: 130/82 He does workout. He denies chest pain, shortness of breath, dizziness.    He is not on cholesterol medication and denies myalgias. His cholesterol is at goal. The cholesterol last visit was:   Lab Results  Component Value Date   CHOL 153 07/27/2019   HDL 62 07/27/2019  [place] 78 07/27/2019   TRIG 47 07/27/2019   CHOLHDL 2.5 07/27/2019    Last A1C in the office was:  Lab Results  Component Value Date   HGBA1C 4.9 07/27/2019   Last GFR: Lab Results  Component Value Date   GFRNONAA 97 03/06/2020   Walter is not on Vitamin D supplement, taking 5000 IU daily.  Lab Results  Component Value Date   VD25OH 66 03/06/2020     Last PSA was: Lab Results  Component Value Date   PSA 0.6 07/27/2019   He would like testosterone checked, was concerned due to muscle mass loss. -  Lab Results  Component Value Date   TESTOSTERONE 262 07/27/2019      Current Medications:  Current Outpatient Medications  on File Prior to Visit  Medication Sig Dispense Refill  . cetirizine (ZYRTEC) 10 MG tablet Take 10 mg by mouth daily.    . CHOLECALCIFEROL PO Take 1 tablet by mouth daily. Takes 5000 units daily    . Cyanocobalamin (B12 LIQUID HEALTH BOOSTER PO) Take 1 applicator by mouth daily.    . fluticasone (CUTIVATE) 0.05 % cream APPLY TOPICALLY TWO TIMES  DAILY AS NEEDED 180 g 3  . fluticasone (FLONASE) 50 MCG/ACT nasal spray USE 1 SPRAY IN EACH NOSTRIL EVERY MORNING 16 g 2  . ipratropium (ATROVENT) 0.06 % nasal spray USE 1 TO 2 SPRAYS IN BOTH  NOSTRILS 2 TO 3 TIMES DAILY 105 mL 3  . losartan (COZAAR) 50 MG tablet Take  1 tablet  Daily  for BP 90 tablet 0  . Methylcellulose, Laxative, (CITRUCEL PO) Take by mouth.    . sildenafil (VIAGRA) 50 MG tablet Take 50 mg daily as needed by mouth for erectile dysfunction.    . vitamin C (ASCORBIC ACID) 500 MG tablet Take 500 mg by mouth daily.    . Zinc 50 MG CAPS Take by mouth daily.    . cetirizine-pseudoephedrine (ALL DAY ALLERGY-D) 5-120 MG tablet Take 1 tablet 2 x /day for Allergies & Congestion (Walter not taking: Reported on 08/28/2020) 180 tablet 0   No current facility-administered medications on file prior to visit.   Allergies:  Allergies  Allergen Reactions  . Hctz [Hydrochlorothiazide]     ED  . Morphine And Related Other (See Comments)    " PASSED OUT" PER PT   Health Maintenance:  Immunization History  Administered Date(s) Administered  . Influenza Inj Mdck Quad With Preservative 05/20/2017, 04/21/2018, 04/20/2019  . Influenza Split 05/10/2014  . Influenza, Seasonal, Injecte, Preservative Fre 05/22/2015  . Influenza,inj,quad, With Preservative 05/21/2016  . Influenza-Unspecified 05/06/2011, 04/18/2013  . PPD Test 05/10/2014  . Tdap 07/29/2011    Tetanus: 07/2011 Flu vaccine: 2021 Shingrix: declines Covid 19: has gotten 3/3, 2021, pfizer  Colonoscopy: 2019 - polyp biopsied - due 5 years EGD: n/a   Eye Exam: has been years,  encouraged to schedule  Dentist: Walter Black - q6 months - no issues Derm: Walter Black  Walter Black: Walter Black as PCP - General (Internal Medicine) Walter Black as Consulting Physician (Gastroenterology) Walter Black as Consulting Physician (General Surgery) Monna Fam, Black as Consulting Physician (Ophthalmology) Kathie Rhodes, Black (Inactive) as Consulting Physician (Urology) Suella Broad, Black as Consulting Physician (Physical Medicine and Rehabilitation) Druscilla Brownie, Black as Consulting Physician (Dermatology) Mosetta Anis, Black as Referring Physician (Allergy)  Medical History:  has IBS (irritable bowel syndrome) - mild w diarrhea; Hypertension; Environmental allergies; DDD (degenerative disc disease), lumbar; ED (erectile  dysfunction); Personal history of colonic polyps; Overweight (BMI 25.0-29.9); B12 deficiency; Vitamin D deficiency; and Cigar smoker on their problem list. 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 Family History:  His family history includes Cancer in his mother; Diabetes in his mother. Social History:   reports that he has been smoking cigars. He has smoked for the past 30.00 years. He has never used smokeless tobacco. He reports current alcohol use of about 14.0 standard drinks of alcohol per week. He reports that he does not use drugs.   Review of Systems:  Review of Systems  Constitutional: Negative for chills, fever, malaise/fatigue and weight loss.  HENT: Negative for congestion, hearing loss, sinus pain, sore throat and tinnitus.   Eyes: Negative for blurred vision and double vision.  Respiratory: Negative for cough, sputum production, shortness of breath and wheezing.   Cardiovascular: Negative for chest pain, palpitations, orthopnea, claudication and leg swelling.  Gastrointestinal: Negative for abdominal  pain, blood in stool, constipation, diarrhea, heartburn, melena, nausea and vomiting.  Genitourinary: Negative.  Negative for dysuria, frequency, hematuria and urgency.  Musculoskeletal: Negative for back pain, joint pain and myalgias.  Skin: Negative for rash.  Neurological: Negative for dizziness, tingling, sensory change, weakness and headaches.  Endo/Heme/Allergies: Positive for environmental allergies. Negative for polydipsia.  Psychiatric/Behavioral: Negative.  Negative for depression and substance abuse. The Walter is not nervous/anxious and does not have insomnia.   All other systems reviewed and are negative.   Physical Exam: Estimated body mass index is 27.18 kg/m as calculated from the following:   Height as of this encounter: 6\' 1"  (1.854 m).   Weight as of this encounter: 206 lb (93.4 kg). BP 130/82   Pulse 60   Temp (!) 97.3 F (36.3 C)   Ht 6\' 1"  (1.854 m)   Wt 206 lb (93.4 kg)   SpO2 96%   BMI 27.18 kg/m  General Appearance: Well nourished, in no apparent distress.  Eyes: PERRLA, EOMs, conjunctiva no swelling or erythema, normal fundi and vessels.  Sinuses: No Frontal/maxillary tenderness  ENT/Mouth: Ext aud canals clear, normal light reflex with TMs without erythema, bulging. Good dentition. No erythema, swelling, or exudate on post pharynx. Tonsils not swollen or erythematous. Hearing normal.  Neck: Supple, thyroid normal. No bruits  Respiratory: Respiratory effort normal, BS equal bilaterally without rales, rhonchi, wheezing or stridor.  Cardio: RRR without murmurs, rubs or gallops. Brisk peripheral pulses without edema.  Chest: symmetric, with normal excursions and percussion.  Abdomen: Soft, nontender, no guarding, rebound, hernias, masses, or organomegaly.  Lymphatics: Non tender without lymphadenopathy.  Genitourinary: Defer, no issues Musculoskeletal: Full ROM all peripheral extremities, 5/5 strength, and normal gait.  Skin: Warm, dry without rashes,  lesions, ecchymosis. Neuro: Cranial nerves intact, reflexes equal bilaterally. Normal muscle tone, no cerebellar symptoms. Sensation intact.  Psych: Awake and oriented X 3, normal affect, Insight and Judgment appropriate.   EKG: Sinus bradycardia, NSCPT  Walter Black 9:25 AM [facility]

## 2020-08-28 ENCOUNTER — Other Ambulatory Visit: Payer: Self-pay | Admitting: Adult Health

## 2020-08-28 ENCOUNTER — Encounter: Payer: Self-pay | Admitting: Adult Health

## 2020-08-28 ENCOUNTER — Other Ambulatory Visit: Payer: Self-pay

## 2020-08-28 ENCOUNTER — Ambulatory Visit: Payer: 59 | Admitting: Adult Health

## 2020-08-28 VITALS — BP 130/82 | HR 60 | Temp 97.3°F | Ht 73.0 in | Wt 206.0 lb

## 2020-08-28 DIAGNOSIS — I10 Essential (primary) hypertension: Secondary | ICD-10-CM | POA: Diagnosis not present

## 2020-08-28 DIAGNOSIS — M5136 Other intervertebral disc degeneration, lumbar region: Secondary | ICD-10-CM

## 2020-08-28 DIAGNOSIS — Z9109 Other allergy status, other than to drugs and biological substances: Secondary | ICD-10-CM

## 2020-08-28 DIAGNOSIS — Z1329 Encounter for screening for other suspected endocrine disorder: Secondary | ICD-10-CM

## 2020-08-28 DIAGNOSIS — E663 Overweight: Secondary | ICD-10-CM

## 2020-08-28 DIAGNOSIS — Z136 Encounter for screening for cardiovascular disorders: Secondary | ICD-10-CM

## 2020-08-28 DIAGNOSIS — E538 Deficiency of other specified B group vitamins: Secondary | ICD-10-CM

## 2020-08-28 DIAGNOSIS — N529 Male erectile dysfunction, unspecified: Secondary | ICD-10-CM

## 2020-08-28 DIAGNOSIS — Z86010 Personal history of colonic polyps: Secondary | ICD-10-CM

## 2020-08-28 DIAGNOSIS — Z0000 Encounter for general adult medical examination without abnormal findings: Secondary | ICD-10-CM

## 2020-08-28 DIAGNOSIS — Z131 Encounter for screening for diabetes mellitus: Secondary | ICD-10-CM

## 2020-08-28 DIAGNOSIS — E559 Vitamin D deficiency, unspecified: Secondary | ICD-10-CM

## 2020-08-28 DIAGNOSIS — Z125 Encounter for screening for malignant neoplasm of prostate: Secondary | ICD-10-CM

## 2020-08-28 DIAGNOSIS — Z1389 Encounter for screening for other disorder: Secondary | ICD-10-CM

## 2020-08-28 DIAGNOSIS — K580 Irritable bowel syndrome with diarrhea: Secondary | ICD-10-CM

## 2020-08-28 DIAGNOSIS — F17290 Nicotine dependence, other tobacco product, uncomplicated: Secondary | ICD-10-CM | POA: Insufficient documentation

## 2020-08-28 DIAGNOSIS — Z13220 Encounter for screening for lipoid disorders: Secondary | ICD-10-CM

## 2020-08-28 NOTE — Patient Instructions (Addendum)
Mr. Walter Black , Thank you for taking time to come for your Annual Wellness Visit. I appreciate your ongoing commitment to your health goals. Please review the following plan we discussed and let me know if I can assist you in the future.   These are the goals we discussed: Goals    . Blood Pressure < 130/80    . Weight (lb) < 200 lb (90.7 kg)       This is a list of the screening recommended for you and due dates:  Health Maintenance  Topic Date Due  . COVID-19 Vaccine (1) Never done  . Tetanus Vaccine  07/28/2021  . Colon Cancer Screening  05/03/2023  . Flu Shot  Completed  .  Hepatitis C: One time screening is recommended by Center for Disease Control  (CDC) for  adults born from [birth year] through 1965.   Completed  . HIV Screening  Completed      Please send covid 19 vaccine information   Recommend reducing alcohol intake  Consider hand held fat monitor - can get for about $25 - [employer] - on [place] and fairly accurate    Know what a healthy weight is for you (roughly BMI <25) and aim to maintain this  Aim for 7+ servings of fruits and vegetables daily  65-80+ fluid ounces of water or unsweet tea for healthy kidneys  Limit to max 1 drink of alcohol per day; avoid smoking/tobacco  Limit animal fats in diet for cholesterol and heart health - choose grass fed whenever available  Avoid highly processed foods, and foods high in saturated/trans fats  Aim for low stress - take time to unwind and care for your mental health  Aim for 150 min of moderate intensity exercise weekly for heart health, and weights twice weekly for bone health  Aim for 7-9 hours of sleep daily        SMOKING CESSATION  American cancer society  02409735329 for more information or for a free program for smoking cessation help.   You can call QUIT SMART 1-800-QUIT-NOW for free nicotine patches or replacement therapy- if they are out- keep calling   cancer center Can call for smoking  cessation classes, [phone]  If you have a smart phone, please look up Smoke Free app, this will help you stay on track and give you information about money you have saved, life that you have gained back and a ton of more information.     ADVANTAGES OF QUITTING SMOKING  Within 20 minutes, blood pressure decreases. Your pulse is at normal level.  After 8 hours, carbon monoxide levels in the blood return to normal. Your oxygen level increases.  After 24 hours, the chance of having a heart attack starts to decrease. Your breath, hair, and body stop smelling like smoke.  After 48 hours, damaged nerve endings begin to recover. Your sense of taste and smell improve.  After 72 hours, the body is virtually free of nicotine. Your bronchial tubes relax and breathing becomes easier.  After 2 to 12 weeks, lungs can hold more air. Exercise becomes easier and circulation improves.  After 1 year, the risk of coronary heart disease is cut in half.  After 5 years, the risk of stroke falls to the same as a nonsmoker.  After 10 years, the risk of lung cancer is cut in half and the risk of other cancers decreases significantly.  After 15 years, the risk of coronary heart disease drops, usually to the level  of a nonsmoker.  You will have extra money to spend on things other than cigarettes.

## 2020-08-29 LAB — HEMOGLOBIN A1C
Hgb A1c MFr Bld: 5 % of total Hgb (ref <5.7)
Mean Plasma Glucose: 97 mg/dL
eAG (mmol/L): 5.4 mmol/L

## 2020-08-29 LAB — URINALYSIS, ROUTINE W REFLEX MICROSCOPIC
Bilirubin Urine: NEGATIVE
Glucose, UA: NEGATIVE
Hgb urine dipstick: NEGATIVE
Ketones, ur: NEGATIVE
Leukocytes,Ua: NEGATIVE
Nitrite: NEGATIVE
Protein, ur: NEGATIVE
Specific Gravity, Urine: 1.007 (ref 1.001–1.03)
pH: 7.5 (ref 5.0–8.0)

## 2020-08-29 LAB — COMPLETE METABOLIC PANEL WITH GFR
AG Ratio: 2 (calc) (ref 1.0–2.5)
ALT: 13 U/L (ref 9–46)
AST: 17 U/L (ref 10–35)
Albumin: 4.5 g/dL (ref 3.6–5.1)
Alkaline phosphatase (APISO): 47 U/L (ref 35–144)
BUN: 10 mg/dL (ref 7–25)
CO2: 30 mmol/L (ref 20–32)
Calcium: 9.7 mg/dL (ref 8.6–10.3)
Chloride: 103 mmol/L (ref 98–110)
Creat: 0.73 mg/dL (ref 0.70–1.33)
GFR, Est African American: 118 mL/min/{1.73_m2} (ref >=60)
GFR, Est Non African American: 102 mL/min/{1.73_m2} (ref >=60)
Globulin: 2.3 g/dL (calc) (ref 1.9–3.7)
Glucose, Bld: 98 mg/dL (ref 65–99)
Potassium: 4.4 mmol/L (ref 3.5–5.3)
Sodium: 139 mmol/L (ref 135–146)
Total Bilirubin: 1 mg/dL (ref 0.2–1.2)
Total Protein: 6.8 g/dL (ref 6.1–8.1)

## 2020-08-29 LAB — CBC WITH DIFFERENTIAL/PLATELET
Absolute Monocytes: 605 cells/uL (ref 200–950)
Basophils Absolute: 50 cells/uL (ref 0–200)
Basophils Relative: 0.9 %
Eosinophils Absolute: 291 cells/uL (ref 15–500)
Eosinophils Relative: 5.2 %
HCT: 44.5 % (ref 38.5–50.0)
Hemoglobin: 15.5 g/dL (ref 13.2–17.1)
Lymphs Abs: 1921 cells/uL (ref 850–3900)
MCH: 33.3 pg — ABNORMAL HIGH (ref 27.0–33.0)
MCHC: 34.8 g/dL (ref 32.0–36.0)
MCV: 95.7 fL (ref 80.0–100.0)
MPV: 9.6 fL (ref 7.5–12.5)
Monocytes Relative: 10.8 %
Neutro Abs: 2733 cells/uL (ref 1500–7800)
Neutrophils Relative %: 48.8 %
Platelets: 222 10*3/uL (ref 140–400)
RBC: 4.65 10*6/uL (ref 4.20–5.80)
RDW: 11.4 % (ref 11.0–15.0)
Total Lymphocyte: 34.3 %
WBC: 5.6 10*3/uL (ref 3.8–10.8)

## 2020-08-29 LAB — MICROALBUMIN / CREATININE URINE RATIO
Creatinine, Urine: 30 mg/dL (ref 20–320)
Microalb, Ur: <0.2 mg/dL

## 2020-08-29 LAB — PSA: PSA: 0.53 ng/mL (ref <=4.0)

## 2020-08-29 LAB — VITAMIN D 25 HYDROXY (VIT D DEFICIENCY, FRACTURES): Vit D, 25-Hydroxy: 52 ng/mL (ref 30–100)

## 2020-08-29 LAB — MAGNESIUM: Magnesium: 2 mg/dL (ref 1.5–2.5)

## 2020-11-02 ENCOUNTER — Other Ambulatory Visit: Payer: Self-pay | Admitting: Internal Medicine

## 2020-11-02 DIAGNOSIS — I10 Essential (primary) hypertension: Secondary | ICD-10-CM

## 2021-05-27 ENCOUNTER — Other Ambulatory Visit: Payer: Self-pay

## 2021-05-27 DIAGNOSIS — I10 Essential (primary) hypertension: Secondary | ICD-10-CM

## 2021-05-27 MED ORDER — FLUTICASONE PROPIONATE 0.05 % EX CREA
TOPICAL_CREAM | CUTANEOUS | 3 refills | Status: AC
Start: 2021-05-27 — End: ?

## 2021-05-27 MED ORDER — IPRATROPIUM BROMIDE 0.06 % NA SOLN: NASAL | 3 refills | Status: DC

## 2021-05-27 MED ORDER — LOSARTAN POTASSIUM 50 MG PO TABS: ORAL_TABLET | ORAL | 3 refills | Status: DC

## 2021-06-12 ENCOUNTER — Other Ambulatory Visit: Payer: Self-pay | Admitting: Internal Medicine

## 2021-06-13 ENCOUNTER — Ambulatory Visit (INDEPENDENT_AMBULATORY_CARE_PROVIDER_SITE_OTHER): Payer: BC Managed Care – PPO | Admitting: Internal Medicine

## 2021-06-13 ENCOUNTER — Encounter: Payer: Self-pay | Admitting: Internal Medicine

## 2021-06-13 ENCOUNTER — Other Ambulatory Visit: Payer: Self-pay

## 2021-06-13 VITALS — BP 133/77 | HR 71 | Temp 97.5°F | Wt 208.0 lb

## 2021-06-13 DIAGNOSIS — Z23 Encounter for immunization: Secondary | ICD-10-CM | POA: Diagnosis not present

## 2021-06-13 DIAGNOSIS — Z1152 Encounter for screening for COVID-19: Secondary | ICD-10-CM

## 2021-06-13 DIAGNOSIS — J4520 Mild intermittent asthma, uncomplicated: Secondary | ICD-10-CM

## 2021-06-13 DIAGNOSIS — R6889 Other general symptoms and signs: Secondary | ICD-10-CM | POA: Diagnosis not present

## 2021-06-13 LAB — POCT INFLUENZA A/B
Influenza A, POC: NEGATIVE
Influenza B, POC: NEGATIVE

## 2021-06-13 LAB — POC COVID19 BINAXNOW: SARS Coronavirus 2 Ag: NEGATIVE

## 2021-06-13 MED ORDER — DEXAMETHASONE 4 MG PO TABS: ORAL_TABLET | ORAL | 0 refills | Status: DC

## 2021-06-13 MED ORDER — BENZONATATE 200 MG PO CAPS: ORAL_CAPSULE | ORAL | 1 refills | Status: DC

## 2021-06-13 MED ORDER — AZITHROMYCIN 250 MG PO TABS: ORAL_TABLET | ORAL | 1 refills | Status: DC

## 2021-06-13 NOTE — Progress Notes (Signed)
    Future Appointments  Date Time Provider Department  06/13/2021 11:30 AM Unk Pinto, MD [facility]  08/28/2021              CPE  9:00 AM Liane Comber, NP [facility]    History of Present Illness:     Patient is a very nice 60 yo single WM     Medications    losartan 50 MG tablet, TAKE 1 TABLET DAILY FOR BLOOD PRESSURE   sildenafil  50 MG tablet, Take 50 mg daily as needed for erectile dysfunction.   cetirizine 10 MG tablet, Take  daily.   FLONASE  nasal spray, USE 1 SPRAY IN EACH NOSTRIL EVERY MORNING   ipratropium  0.06 % nasal spray, Use 1 to 2 sprays in both nostrils 2 to 3 times daily   B12 LIQUID, Take 1 applicator daily.   Vitamin D 5,000 u, Take 1 tablet  daily. Takes daily   fluticasone 0.05 % cream, APPLY TOPICALLY TWO TIMES  DAILY AS NEEDED   CITRUCEL, Take    vitamin C 500 MG tablet, Take daily.   Zinc 50 MG CAPS, Take daily.  Problem list He has IBS (irritable bowel syndrome) - mild w diarrhea; Hypertension; Environmental allergies; DDD (degenerative disc disease), lumbar; ED (erectile dysfunction); Personal history of colonic polyps; Overweight (BMI 25.0-29.9); B12 deficiency; Vitamin D deficiency; and Cigar smoker on their problem list.   Observations/Objective:  BP 133/77   Pulse 71   Temp (!) 97.5 F (36.4 C)   Wt 208 lb (94.3 kg)   SpO2 95%   BMI 27.44 kg/m   Dry cough. No stridor.   HEENT - WNL. Neck - supple.  Chest - Bilat rales , no rhonchi. (+) post - tussive end terminal expiratory wheezes. Cor - Nl HS. RRR w/o sig MGR. PP 1(+). No edema. MS- FROM w/o deformities.  Gait Nl. Neuro -  Nl w/o focal abnormalities.  Assessment and Plan:  1. Mild intermittent asthmatic bronchitis without complication  - dexamethasone  4 MG tablet;  Take 1 tab 3 x day - 3 days, then 2 x day - 3 days, then 1 tab daily   Dispense: 20 tablet  - azithromycin 250 MG tablet;  Take 2 tablets with Food on    Dispense: 30 capsule; Refill: 1  2. Flu-like  symptoms  - POCT Influenza A/B  3. Encounter for screening for COVID-19  - POC COVID-19  4. Flu vaccine need  - Flu Vaccine QUAD 6+ mos PF IM (Fluarix Quad PF)       Follow Up Instructions:        I discussed the assessment and treatment plan with the patient. The patient was provided an opportunity to ask questions and all were answered. The patient agreed with the plan and demonstrated an understanding of the instructions.       The patient was advised to call back or seek an in-person evaluation if the symptoms worsen or if the condition fails to improve as anticipated.    Kirtland Bouchard, MD

## 2021-08-28 ENCOUNTER — Encounter: Payer: 59 | Admitting: Adult Health

## 2021-10-09 ENCOUNTER — Other Ambulatory Visit: Payer: Self-pay

## 2021-10-09 DIAGNOSIS — I10 Essential (primary) hypertension: Secondary | ICD-10-CM

## 2021-10-09 NOTE — Telephone Encounter (Signed)
Refill request for Fluticasone cream and Losartan ?

## 2021-10-14 ENCOUNTER — Other Ambulatory Visit: Payer: Self-pay | Admitting: Internal Medicine

## 2021-10-14 NOTE — Progress Notes (Signed)
Liane Comber, NP  Unk Pinto, MD ?FYI Dr. Melford Aase - should we just leave his chart open but not provide refills or would you proceed with closing the chart at this point?   ?  ?   ?Previous Messages ?  ?----- Message -----  ?From: Tye Savoy  ?Sent: 10/13/2021   8:29 AM EDT  ?To: Liane Comber, NP  ?Subject: Medication Refills                            ? ?Called Mr. Cordial letting him know that without an office visit he would not be able to get a medication refill on his blood pressure medication. I told him the last time we saw him was for an acute office visit to treat Covid like symptoms but was not for a follow up appointment per the office policy for the medications he is on. He said that no one wants to work at his job and he has been having to pull 13 hour shifts and the last thing he wants to make room for is his CPE. I reiterated the office policy once again that it is for his well being as a patient, we can not keep prescribing a medication without consistent follow ups. He said that it showed him that we put him on something unnecessary to begin with or we would've have sent it in, and he still refused to make an appointment.  ? ?Thank you   ?

## 2022-02-26 ENCOUNTER — Encounter: Payer: Self-pay | Admitting: Nurse Practitioner

## 2022-02-26 ENCOUNTER — Ambulatory Visit (INDEPENDENT_AMBULATORY_CARE_PROVIDER_SITE_OTHER): Payer: BC Managed Care – PPO | Admitting: Nurse Practitioner

## 2022-02-26 VITALS — BP 140/88 | HR 64 | Temp 97.5°F | Ht 73.0 in | Wt 201.4 lb

## 2022-02-26 DIAGNOSIS — Z13220 Encounter for screening for lipoid disorders: Secondary | ICD-10-CM

## 2022-02-26 DIAGNOSIS — E559 Vitamin D deficiency, unspecified: Secondary | ICD-10-CM | POA: Diagnosis not present

## 2022-02-26 DIAGNOSIS — Z79899 Other long term (current) drug therapy: Secondary | ICD-10-CM

## 2022-02-26 DIAGNOSIS — M79672 Pain in left foot: Secondary | ICD-10-CM

## 2022-02-26 DIAGNOSIS — Z1389 Encounter for screening for other disorder: Secondary | ICD-10-CM | POA: Diagnosis not present

## 2022-02-26 DIAGNOSIS — Z136 Encounter for screening for cardiovascular disorders: Secondary | ICD-10-CM

## 2022-02-26 DIAGNOSIS — R350 Frequency of micturition: Secondary | ICD-10-CM | POA: Diagnosis not present

## 2022-02-26 DIAGNOSIS — Z860100 Personal history of colon polyps, unspecified: Secondary | ICD-10-CM

## 2022-02-26 DIAGNOSIS — N529 Male erectile dysfunction, unspecified: Secondary | ICD-10-CM

## 2022-02-26 DIAGNOSIS — Z125 Encounter for screening for malignant neoplasm of prostate: Secondary | ICD-10-CM | POA: Diagnosis not present

## 2022-02-26 DIAGNOSIS — N401 Enlarged prostate with lower urinary tract symptoms: Secondary | ICD-10-CM | POA: Diagnosis not present

## 2022-02-26 DIAGNOSIS — M51369 Other intervertebral disc degeneration, lumbar region without mention of lumbar back pain or lower extremity pain: Secondary | ICD-10-CM

## 2022-02-26 DIAGNOSIS — Z1329 Encounter for screening for other suspected endocrine disorder: Secondary | ICD-10-CM

## 2022-02-26 DIAGNOSIS — Z131 Encounter for screening for diabetes mellitus: Secondary | ICD-10-CM | POA: Diagnosis not present

## 2022-02-26 DIAGNOSIS — M5136 Other intervertebral disc degeneration, lumbar region: Secondary | ICD-10-CM

## 2022-02-26 DIAGNOSIS — Z9109 Other allergy status, other than to drugs and biological substances: Secondary | ICD-10-CM

## 2022-02-26 DIAGNOSIS — Z0000 Encounter for general adult medical examination without abnormal findings: Secondary | ICD-10-CM

## 2022-02-26 DIAGNOSIS — I10 Essential (primary) hypertension: Secondary | ICD-10-CM | POA: Diagnosis not present

## 2022-02-26 DIAGNOSIS — Z0001 Encounter for general adult medical examination with abnormal findings: Secondary | ICD-10-CM

## 2022-02-26 DIAGNOSIS — K580 Irritable bowel syndrome with diarrhea: Secondary | ICD-10-CM

## 2022-02-26 MED ORDER — IPRATROPIUM BROMIDE 0.06 % NA SOLN: NASAL | 3 refills | Status: DC

## 2022-02-26 MED ORDER — LOSARTAN POTASSIUM 50 MG PO TABS: ORAL_TABLET | ORAL | 3 refills | Status: DC

## 2022-02-26 NOTE — Patient Instructions (Signed)
Blood Pressure Record Sheet To take your blood pressure, you will need a blood pressure machine. You may be prescribed one, or you can buy a blood pressure machine (blood pressure monitor) at your clinic, drug store, or online. When choosing one, look for these features: An automatic monitor that has an arm cuff. A cuff that wraps snugly, but not too tightly, around your upper arm. You should be able to fit only one finger between your arm and the cuff. A device that stores blood pressure reading results. Do not choose a monitor that measures your blood pressure from your wrist or finger. Follow your health care provider's instructions for how to take your blood pressure. To use this form: Get one reading in the morning (a.m.) before you take any medicines. Get one reading in the evening (p.m.) before supper. Take at least two readings with each blood pressure check. This makes sure the results are correct. Wait 1-2 minutes between measurements. Write down the results in the spaces on this form. Repeat this once a week, or as told by your health care provider. Make a follow-up appointment with your health care provider to discuss the results. Blood pressure log Date: _______________________ a.m. _____________________(1st reading) _____________________(2nd reading) p.m. _____________________(1st reading) _____________________(2nd reading) Date: _______________________ a.m. _____________________(1st reading) _____________________(2nd reading) p.m. _____________________(1st reading) _____________________(2nd reading) Date: _______________________ a.m. _____________________(1st reading) _____________________(2nd reading) p.m. _____________________(1st reading) _____________________(2nd reading) Date: _______________________ a.m. _____________________(1st reading) _____________________(2nd reading) p.m. _____________________(1st reading) _____________________(2nd reading) Date:  _______________________ a.m. _____________________(1st reading) _____________________(2nd reading) p.m. _____________________(1st reading) _____________________(2nd reading) This information is not intended to replace advice given to you by your health care provider. Make sure you discuss any questions you have with your health care provider. Document Revised: 03/06/2021 Document Reviewed: 03/06/2021 Elsevier Patient Education  2023 [employer]  

## 2022-02-26 NOTE — Progress Notes (Signed)
Complete Physical  Assessment and Plan:  Encounter for general adult medical examination with abnormal findings 1 year  Essential hypertension Above [place] today-asymptomatic. Continue medications as directed. Monitor BP in home and record for further review and evaluation. Call if consistently greater than 130/80.  Discussed DASH (Dietary Approaches to Stop Hypertension) DASH diet is lower in sodium than a typical American diet. Cut back on foods that are high in saturated fat, cholesterol, and trans fats. Eat more whole-grain foods, fish, poultry, and nuts Remain active and exercise as tolerated daily.  Check CMP/CBC   Irritable bowel syndrome with diarrhea Currently controlled. Avoid triggers, find ways to reduce stress.  Personal history of colonic polyps Had in 2019, due 5 year follow up  DDD (degenerative disc disease), lumbar No recent flare, currently well managed.  Erectile dysfunction, unspecified erectile dysfunction type Sildenafil 50 mg working well  Environmental allergies Managed with current medications. Avoid triggers.  Screening for thyroid disorder -     TSH  Screening for hyperlipidemia -     Lipid panel   Screening for diabetes mellitus -     Hemoglobin A1c  Screening for cardiovascular condition -     EKG 12-Lead  Screening for prostate cancer -     PSA  Screening for hematuria or proteinuria -     Urinalysis, Complete   Medication management -     CBC with Differential/Platelet -     CMP/GFR  Vitamin D deficiency -     VITAMIN D 25 Hydroxy (Vit-D Deficiency, Fractures)  Screening for hematuria or proteinuria  -UA  Left foot pain. Discussed likely a spur Discussed foot brace. Will place left foot x-ray for further review ane evaluation.  Discussed med's effects and SE's.   Orders Placed This Encounter  Procedures   DG Foot Complete Left    Standing Status:   Future    Standing Expiration Date:   02/27/2023    Order Specific  Question:   Reason for Exam (SYMPTOM  OR DIAGNOSIS REQUIRED)    Answer:   Pain at ball of 1st metatarsal    Order Specific Question:   Preferred imaging location?    Answer:   [id number]   CBC with Differential/Platelet   COMPLETE METABOLIC PANEL WITH GFR   Magnesium   Lipid panel   TSH   Hemoglobin A1c   VITAMIN D 25 Hydroxy (Vit-D Deficiency, Fractures)   Urinalysis, Routine w reflex microscopic   PSA   EKG 12-Lead   Meds ordered this encounter  Medications   ipratropium (ATROVENT) 0.06 % nasal spray    Sig: Use 1 to 2 sprays in both nostrils 2 to 3 times daily    Dispense:  105 mL    Refill:  3    Requesting 1 year supply   losartan (COZAAR) 50 MG tablet    Sig: TAKE 1 TABLET BY MOUTH  DAILY FOR BLOOD PRESSURE    Dispense:  90 tablet    Refill:  3    Requesting 1 year supply    Screening labs and tests as requested with regular follow-up as recommended.  Over 30 minutes of exam, counseling, chart review and critical decision making was performed  Future Appointments  Date Time Provider [place]  03/01/2023  2:00 PM Darrol Jump, NP [facility] None    HPI 61 y.o. Male Patient, presents for a complete physical. He has IBS (irritable bowel syndrome) - mild w diarrhea; Hypertension; Environmental allergies; DDD (degenerative disc disease), lumbar;  ED (erectile dysfunction); Personal history of colonic polyps; Overweight (BMI 25.0-29.9); B12 deficiency; Vitamin D deficiency; and Cigar smoker on their problem list.   He complains of right sided foot pain that is located at the ball of his right first toe.   Has tried using ball to stretch the area.  Hurts less in the morning.  Hurts worse at the end of the day when he takes his shoe off and wears crocs.    Dating girlfriend of 61 years, works in [employer].No concerns today, declines STD testing.    Followed by Dr. Allyson Sabal (derm) - for rash/skin concerns and routine check ups.   BMI  is Body mass index is 26.57 kg/m., he has been working on diet and exercise.   Wt Readings from Last 3 Encounters:  02/26/22 201 lb 6.4 oz (91.4 kg)  06/13/21 208 lb (94.3 kg)  08/28/20 206 lb (93.4 kg)   He doesn't check BPs at home, does have a cuff. Today their BP is   He does workout. He denies chest pain, shortness of breath, dizziness.    He is not on cholesterol medication and denies myalgias. His cholesterol is at goal. The cholesterol last visit was:   Lab Results  Component Value Date   CHOL 153 07/27/2019   HDL 62 07/27/2019   LDLCALC 78 07/27/2019   TRIG 47 07/27/2019   CHOLHDL 2.5 07/27/2019    Last A1C in the office was:  Lab Results  Component Value Date   HGBA1C 5.0 08/28/2020   Last GFR: Lab Results  Component Value Date   GFRNONAA 102 08/28/2020   Patient is not on Vitamin D supplement, taking 5000 IU daily.  Lab Results  Component Value Date   VD25OH 52 08/28/2020     Last PSA was: Lab Results  Component Value Date   PSA 0.53 08/28/2020   Last testosterone check: Lab Results  Component Value Date   TESTOSTERONE 262 07/27/2019      Current Medications:  Current Outpatient Medications on File Prior to Visit  Medication Sig Dispense Refill   azithromycin (ZITHROMAX) 250 MG tablet Take 2 tablets with Food on  Day 1, then 1 tablet Daily with Food for Sinusitis / Bronchitis 6 each 1   benzonatate (TESSALON) 200 MG capsule Take 1 perle 3 x / day to prevent cough 30 capsule 1   cetirizine (ZYRTEC) 10 MG tablet Take 10 mg by mouth daily.     CHOLECALCIFEROL PO Take 1 tablet by mouth daily. Takes 5000 units daily     Cyanocobalamin (B12 LIQUID HEALTH BOOSTER PO) Take 1 applicator by mouth daily.     dexamethasone (DECADRON) 4 MG tablet Take 1 tab 3 x day - 3 days, then 2 x day - 3 days, then 1 tab daily 20 tablet 0   fluticasone (CUTIVATE) 0.05 % cream APPLY TOPICALLY TWO TIMES  DAILY AS NEEDED 180 g 3   fluticasone (FLONASE) 50 MCG/ACT nasal spray  USE 1 SPRAY IN EACH NOSTRIL EVERY MORNING 16 g 2   ipratropium (ATROVENT) 0.06 % nasal spray Use 1 to 2 sprays in both nostrils 2 to 3 times daily 105 mL 3   losartan (COZAAR) 50 MG tablet TAKE 1 TABLET BY MOUTH  DAILY FOR BLOOD PRESSURE 90 tablet 3   Methylcellulose, Laxative, (CITRUCEL PO) Take by mouth.     sildenafil (VIAGRA) 50 MG tablet Take 50 mg daily as needed by mouth for erectile dysfunction.  vitamin C (ASCORBIC ACID) 500 MG tablet Take 500 mg by mouth daily.     Zinc 50 MG CAPS Take by mouth daily.     No current facility-administered medications on file prior to visit.   Allergies:  Allergies  Allergen Reactions   Hctz [Hydrochlorothiazide]     ED   Morphine And Related Other (See Comments)    " PASSED OUT" PER PT   Health Maintenance:  Immunization History  Administered Date(s) Administered   Influenza Inj Mdck Quad With Preservative 05/20/2017, 04/21/2018, 04/20/2019   Influenza Split 05/10/2014   Influenza, Seasonal, Injecte, Preservative Fre 05/22/2015   Influenza,inj,Quad PF,6+ Mos 06/13/2021   Influenza,inj,quad, With Preservative 05/21/2016   Influenza-Unspecified 05/06/2011, 04/18/2013, 05/06/2020   Moderna Sars-Covid-2 Vaccination 09/06/2019, 10/05/2019, 05/28/2020   PPD Test 05/10/2014   Tdap 07/29/2011    Tetanus: Due  Flu vaccine: Due 04/2022 Shingrix: declines Covid 19: has gotten 3/3, 2021, pfizer  Colonoscopy: 2019 - polyp biopsied - due 5 years EGD: n/a   Eye Exam: has been years, encouraged to schedule  Dentist: Dr. Gloriann Loan - q6 months - no issues Derm: Dr. Ubaldo Glassing  Patient Care Team: Unk Pinto, MD as PCP - General (Internal Medicine) Milus Banister, MD as Consulting Physician (Gastroenterology) Michael Boston, MD as Consulting Physician (General Surgery) Monna Fam, MD as Consulting Physician (Ophthalmology) Kathie Rhodes, MD (Inactive) as Consulting Physician (Urology) Suella Broad, MD as Consulting Physician (Physical  Medicine and Rehabilitation) Druscilla Brownie, MD as Consulting Physician (Dermatology) Mosetta Anis, MD as Referring Physician (Allergy)  Medical History:  has IBS (irritable bowel syndrome) - mild w diarrhea; Hypertension; Environmental allergies; DDD (degenerative disc disease), lumbar; ED (erectile dysfunction); Personal history of colonic polyps; Overweight (BMI 25.0-29.9); B12 deficiency; Vitamin D deficiency; and Cigar smoker on their problem list. 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 Family History:  His family history includes Cancer in his mother; Diabetes in his mother. Social History:   reports that he has been smoking cigars. He has never used smokeless tobacco. He reports current alcohol use of about 14.0 standard drinks of alcohol per week. He reports that he does not use drugs.   Review of Systems:  Review of Systems  Constitutional:  Negative for chills, fever, malaise/fatigue and weight loss.  HENT:  Negative for congestion, hearing loss, sinus pain, sore throat and tinnitus.   Eyes:  Negative for blurred vision and double vision.  Respiratory:  Negative for cough, sputum production, shortness of breath and wheezing.   Cardiovascular:  Negative for chest pain, palpitations, orthopnea, claudication and leg swelling.  Gastrointestinal:  Negative for abdominal pain, blood in stool, constipation, diarrhea, heartburn, melena, nausea and vomiting.  Genitourinary: Negative.  Negative for dysuria, frequency, hematuria and urgency.  Musculoskeletal:  Negative for back pain, joint pain and myalgias.       Reports right toe pain  Skin:  Negative for rash.  Neurological:  Negative for dizziness, tingling, sensory change, weakness and headaches.  Endo/Heme/Allergies:  Positive for environmental allergies. Negative for polydipsia.  Psychiatric/Behavioral:  Negative.  Negative for depression and substance abuse. The patient is not nervous/anxious and does not have insomnia.   All other systems reviewed and are negative.   Physical Exam: Estimated body mass index is 26.57 kg/m as calculated from the following:   Height as of this encounter: '6\' 1"'$  (1.854 m).   Weight as of  this encounter: 201 lb 6.4 oz (91.4 kg). Pulse 64   Temp (!) 97.5 F (36.4 C)   Ht '6\' 1"'$  (1.854 m)   Wt 201 lb 6.4 oz (91.4 kg)   SpO2 95%   BMI 26.57 kg/m  General Appearance: Well nourished, in no apparent distress.  Eyes: PERRLA, EOMs, conjunctiva no swelling or erythema, normal fundi and vessels.  Sinuses: No Frontal/maxillary tenderness  ENT/Mouth: Ext aud canals clear, normal light reflex with TMs without erythema, bulging. Good dentition. No erythema, swelling, or exudate on post pharynx. Tonsils not swollen or erythematous. Hearing normal.  Neck: Supple, thyroid normal. No bruits  Respiratory: Respiratory effort normal, BS equal bilaterally without rales, rhonchi, wheezing or stridor.  Cardio: RRR without murmurs, rubs or gallops. Brisk peripheral pulses without edema.  Chest: symmetric, with normal excursions and percussion.  Abdomen: Soft, nontender, no guarding, rebound, hernias, masses, or organomegaly.  Lymphatics: Non tender without lymphadenopathy.  Genitourinary: Defer, no issues Musculoskeletal: Full ROM all peripheral extremities, 5/5 strength, and normal gait.  Skin: Warm, dry without rashes, lesions, ecchymosis. Neuro: Cranial nerves intact, reflexes equal bilaterally. Normal muscle tone, no cerebellar symptoms. Sensation intact.  Psych: Awake and oriented X 3, normal affect, Insight and Judgment appropriate.   EKG: Sinus bradycardia   Tamana Hatfield 2:23 PM [facility]

## 2022-02-27 LAB — URINALYSIS, ROUTINE W REFLEX MICROSCOPIC
Bilirubin Urine: NEGATIVE
Glucose, UA: NEGATIVE
Hgb urine dipstick: NEGATIVE
Ketones, ur: NEGATIVE
Leukocytes,Ua: NEGATIVE
Nitrite: NEGATIVE
Protein, ur: NEGATIVE
Specific Gravity, Urine: 1.007 (ref 1.001–1.035)
pH: 7 (ref 5.0–8.0)

## 2022-02-27 LAB — COMPLETE METABOLIC PANEL WITH GFR
AG Ratio: 2 (calc) (ref 1.0–2.5)
ALT: 14 U/L (ref 9–46)
AST: 16 U/L (ref 10–35)
Albumin: 4.7 g/dL (ref 3.6–5.1)
Alkaline phosphatase (APISO): 59 U/L (ref 35–144)
BUN: 13 mg/dL (ref 7–25)
CO2: 28 mmol/L (ref 20–32)
Calcium: 10 mg/dL (ref 8.6–10.3)
Chloride: 103 mmol/L (ref 98–110)
Creat: 0.81 mg/dL (ref 0.70–1.35)
Globulin: 2.3 g/dL (calc) (ref 1.9–3.7)
Glucose, Bld: 95 mg/dL (ref 65–99)
Potassium: 4.4 mmol/L (ref 3.5–5.3)
Sodium: 140 mmol/L (ref 135–146)
Total Bilirubin: 0.8 mg/dL (ref 0.2–1.2)
Total Protein: 7 g/dL (ref 6.1–8.1)
eGFR: 101 mL/min/{1.73_m2} (ref >=60)

## 2022-02-27 LAB — CBC WITH DIFFERENTIAL/PLATELET
Absolute Monocytes: 534 cells/uL (ref 200–950)
Basophils Absolute: 41 cells/uL (ref 0–200)
Basophils Relative: 0.7 %
Eosinophils Absolute: 162 cells/uL (ref 15–500)
Eosinophils Relative: 2.8 %
HCT: 44.3 % (ref 38.5–50.0)
Hemoglobin: 15.3 g/dL (ref 13.2–17.1)
Lymphs Abs: 1676 cells/uL (ref 850–3900)
MCH: 32.6 pg (ref 27.0–33.0)
MCHC: 34.5 g/dL (ref 32.0–36.0)
MCV: 94.3 fL (ref 80.0–100.0)
MPV: 10 fL (ref 7.5–12.5)
Monocytes Relative: 9.2 %
Neutro Abs: 3387 cells/uL (ref 1500–7800)
Neutrophils Relative %: 58.4 %
Platelets: 239 10*3/uL (ref 140–400)
RBC: 4.7 10*6/uL (ref 4.20–5.80)
RDW: 11.3 % (ref 11.0–15.0)
Total Lymphocyte: 28.9 %
WBC: 5.8 10*3/uL (ref 3.8–10.8)

## 2022-02-27 LAB — LIPID PANEL
Cholesterol: 150 mg/dL (ref <200)
HDL: 50 mg/dL (ref >=40)
LDL Cholesterol (Calc): 84 mg/dL (calc)
Non-HDL Cholesterol (Calc): 100 mg/dL (calc) (ref <130)
Total CHOL/HDL Ratio: 3 (calc) (ref <5.0)
Triglycerides: 75 mg/dL (ref <150)

## 2022-02-27 LAB — HEMOGLOBIN A1C
Hgb A1c MFr Bld: 5.1 % of total Hgb (ref <5.7)
Mean Plasma Glucose: 100 mg/dL
eAG (mmol/L): 5.5 mmol/L

## 2022-02-27 LAB — MAGNESIUM: Magnesium: 2.1 mg/dL (ref 1.5–2.5)

## 2022-02-27 LAB — PSA: PSA: 0.65 ng/mL (ref <=4.00)

## 2022-02-27 LAB — TSH: TSH: 1.97 mIU/L (ref 0.40–4.50)

## 2022-02-27 LAB — VITAMIN D 25 HYDROXY (VIT D DEFICIENCY, FRACTURES): Vit D, 25-Hydroxy: 59 ng/mL (ref 30–100)

## 2022-03-02 ENCOUNTER — Telehealth: Payer: Self-pay | Admitting: Nurse Practitioner

## 2022-03-02 ENCOUNTER — Other Ambulatory Visit: Payer: Self-pay | Admitting: Nurse Practitioner

## 2022-03-02 MED ORDER — CELECOXIB 100 MG PO CAPS: 100.0000 mg | ORAL_CAPSULE | Freq: Two times a day (BID) | ORAL | 0 refills | Status: DC

## 2022-03-02 NOTE — Telephone Encounter (Signed)
Patient states that you were going to call in Celebrex for him at his CPE visit last week but pharmacy hasn't gotten anything from [place]. He would like it sent to [facility]. Also, wanted to know if his EKG was normal.

## 2022-04-02 ENCOUNTER — Other Ambulatory Visit: Payer: Self-pay | Admitting: Nurse Practitioner

## 2022-04-02 DIAGNOSIS — M25512 Pain in left shoulder: Secondary | ICD-10-CM | POA: Diagnosis not present

## 2022-04-02 DIAGNOSIS — M79641 Pain in right hand: Secondary | ICD-10-CM | POA: Diagnosis not present

## 2022-04-06 DIAGNOSIS — M25512 Pain in left shoulder: Secondary | ICD-10-CM | POA: Diagnosis not present

## 2022-04-06 DIAGNOSIS — M545 Low back pain, unspecified: Secondary | ICD-10-CM | POA: Diagnosis not present

## 2022-04-29 DIAGNOSIS — M25512 Pain in left shoulder: Secondary | ICD-10-CM | POA: Diagnosis not present

## 2022-04-29 DIAGNOSIS — M19012 Primary osteoarthritis, left shoulder: Secondary | ICD-10-CM | POA: Diagnosis not present

## 2022-05-04 ENCOUNTER — Other Ambulatory Visit: Payer: Self-pay | Admitting: Nurse Practitioner

## 2022-05-18 DIAGNOSIS — M47816 Spondylosis without myelopathy or radiculopathy, lumbar region: Secondary | ICD-10-CM | POA: Diagnosis not present

## 2022-05-18 DIAGNOSIS — Z79899 Other long term (current) drug therapy: Secondary | ICD-10-CM | POA: Diagnosis not present

## 2022-05-18 DIAGNOSIS — M545 Low back pain, unspecified: Secondary | ICD-10-CM | POA: Diagnosis not present

## 2022-05-18 DIAGNOSIS — Z5181 Encounter for therapeutic drug level monitoring: Secondary | ICD-10-CM | POA: Diagnosis not present

## 2022-05-18 DIAGNOSIS — M25512 Pain in left shoulder: Secondary | ICD-10-CM | POA: Diagnosis not present

## 2022-05-18 DIAGNOSIS — M19012 Primary osteoarthritis, left shoulder: Secondary | ICD-10-CM | POA: Diagnosis not present

## 2022-05-19 DIAGNOSIS — H6240 Otitis externa in other diseases classified elsewhere, unspecified ear: Secondary | ICD-10-CM | POA: Diagnosis not present

## 2022-05-19 DIAGNOSIS — L309 Dermatitis, unspecified: Secondary | ICD-10-CM | POA: Diagnosis not present

## 2022-05-19 DIAGNOSIS — B368 Other specified superficial mycoses: Secondary | ICD-10-CM | POA: Diagnosis not present

## 2022-05-20 DIAGNOSIS — R21 Rash and other nonspecific skin eruption: Secondary | ICD-10-CM | POA: Diagnosis not present

## 2022-06-02 DIAGNOSIS — R21 Rash and other nonspecific skin eruption: Secondary | ICD-10-CM | POA: Diagnosis not present

## 2022-06-02 DIAGNOSIS — B369 Superficial mycosis, unspecified: Secondary | ICD-10-CM | POA: Diagnosis not present

## 2022-06-02 DIAGNOSIS — H6240 Otitis externa in other diseases classified elsewhere, unspecified ear: Secondary | ICD-10-CM | POA: Diagnosis not present

## 2022-06-17 ENCOUNTER — Encounter: Payer: Self-pay | Admitting: Internal Medicine

## 2022-08-17 DIAGNOSIS — M19012 Primary osteoarthritis, left shoulder: Secondary | ICD-10-CM | POA: Diagnosis not present

## 2022-08-17 DIAGNOSIS — M47896 Other spondylosis, lumbar region: Secondary | ICD-10-CM | POA: Diagnosis not present

## 2022-09-01 ENCOUNTER — Encounter: Payer: Self-pay | Admitting: Nurse Practitioner

## 2022-09-01 ENCOUNTER — Ambulatory Visit: Payer: BC Managed Care – PPO | Admitting: Nurse Practitioner

## 2022-09-01 VITALS — BP 134/86 | HR 71 | Temp 97.7°F | Ht 73.0 in | Wt 205.4 lb

## 2022-09-01 DIAGNOSIS — E782 Mixed hyperlipidemia: Secondary | ICD-10-CM

## 2022-09-01 DIAGNOSIS — Z9109 Other allergy status, other than to drugs and biological substances: Secondary | ICD-10-CM

## 2022-09-01 DIAGNOSIS — I10 Essential (primary) hypertension: Secondary | ICD-10-CM

## 2022-09-01 DIAGNOSIS — K580 Irritable bowel syndrome with diarrhea: Secondary | ICD-10-CM

## 2022-09-01 DIAGNOSIS — N529 Male erectile dysfunction, unspecified: Secondary | ICD-10-CM

## 2022-09-01 DIAGNOSIS — Z79899 Other long term (current) drug therapy: Secondary | ICD-10-CM

## 2022-09-01 DIAGNOSIS — M5136 Other intervertebral disc degeneration, lumbar region: Secondary | ICD-10-CM | POA: Diagnosis not present

## 2022-09-01 DIAGNOSIS — L739 Follicular disorder, unspecified: Secondary | ICD-10-CM

## 2022-09-01 DIAGNOSIS — E559 Vitamin D deficiency, unspecified: Secondary | ICD-10-CM

## 2022-09-01 MED ORDER — SULFAMETHOXAZOLE-TRIMETHOPRIM 800-160 MG PO TABS: 1.0000 | ORAL_TABLET | Freq: Two times a day (BID) | ORAL | 0 refills | Status: AC

## 2022-09-01 MED ORDER — MUPIROCIN CALCIUM 2 % EX CREA: 1.0000 | TOPICAL_CREAM | Freq: Two times a day (BID) | CUTANEOUS | 0 refills | Status: AC

## 2022-09-01 NOTE — Patient Instructions (Signed)
Skin Abscess  A skin abscess is an infected spot of skin. It can have pus in it. An abscess can happen in any part of your body. Some abscesses break open (rupture) on their own. Most keep getting worse unless they are treated. If your abscess is not treated, the infection can spread deeper into your body and blood. This can make you feel sick. What are the causes? Germs that enter your skin. This may happen if you have: A cut or scrape. A wound from a needle or an insect bite. Blocked oil or sweat glands. A problem with the spot where your hair goes into your skin. A fluid-filled sac called a cyst under your skin. What increases the risk? Having problems with how your blood moves through your body. Having a weak body defense system (immune system). Having diabetes. Having dry and irritated skin. Needing to get shots often. Putting drugs into your body with a needle. Having a splinter or something else in your skin. Smoking. What are the signs or symptoms? A firm bump under your skin that hurts. A bump with pus at the top. Redness and swelling. Warm or tender spots. A sore on the skin. How is this treated? You may need to: Put a heat pack or a warm, wet washcloth on the spot. Have the pus drained. Take antibiotics. Follow these instructions at home: Medicines Take over-the-counter and prescription medicines only as told by your doctor. If you were prescribed antibiotics, take them as told by your doctor. Do not stop taking them even if you start to feel better. Abscess care  If you have an abscess that has not drained, put heat on it. Use the heat source that your doctor recommends, such as a moist heat pack or a heating pad. Place a towel between your skin and the heat source. Leave the heat on for 20-30 minutes. If your skin turns bright red, take off the heat right away to prevent burns. The risk of burns is higher if you cannot feel pain, heat, or cold. Follow  instructions from your doctor about how to take care of your abscess. Make sure you: Cover the abscess with a bandage. Wash your hands with soap and water for at least 20 seconds before and after you change your bandage. If you cannot use soap and water, use hand sanitizer. Change your bandage as told by your doctor. Check your abscess every day for signs that the infection is getting worse. Check for: More redness, swelling, or pain. More fluid or blood. Warmth. More pus or a worse smell. General instructions To keep the infection from spreading: Do not share personal items or towels. Do not go in a hot tub with others. Avoid making skin contact with others. Be careful when you get rid of used bandages or any pus from the abscess. Do not smoke or use any products that contain nicotine or tobacco. If you need help quitting, ask your doctor. Contact a doctor if: You see red streaks on your skin near the abscess. You have any signs of worse infection. You vomit every time you eat or drink. You have a fever, chills, or muscle aches. The cyst or abscess comes back. Get help right away if: You have very bad pain. You make less pee (urine) than normal. This information is not intended to replace advice given to you by your health care provider. Make sure you discuss any questions you have with your health care provider. Document Revised: 02/04/2022  Document Reviewed: 02/04/2022 Elsevier Patient Education  [place].

## 2022-09-01 NOTE — Progress Notes (Signed)
Follow Up  Assessment and Plan:  Essential hypertension Continue Losartan Discussed DASH (Dietary Approaches to Stop Hypertension) DASH diet is lower in sodium than a typical American diet. Cut back on foods that are high in saturated fat, cholesterol, and trans fats. Eat more whole-grain foods, fish, poultry, and nuts Remain active and exercise as tolerated daily.  Monitor BP at home-Call if greater than 130/80.  Check CMP/CBC  Irritable bowel syndrome with diarrhea Currently controlled. Avoid triggers, reduce stress, suggested probiotic.  DDD (degenerative disc disease), lumbar Controlled Continue to monitor  Erectile dysfunction, unspecified erectile dysfunction type Continue Sildenafil 50   Environmental allergies Continue antihistamine Avoid triggers  Medication management All medications discussed and reviewed in full. All questions and concerns regarding medications addressed.    Vitamin D deficiency Continue supplement for goal of 60-100 Monitor levels  Folliculitis Pustule drained via manual manipulation, cleansed with alcohol. Start Bactrim as directed Apply topical Mupirocin cream as directed Apply warm compress to area several times throughout the day to continue to have area drain.   Orders Placed This Encounter  Procedures   COMPLETE METABOLIC PANEL WITH GFR   Meds ordered this encounter  Medications   mupirocin cream (BACTROBAN) 2 %    Sig: Apply 1 Application topically 2 (two) times daily.    Dispense:  15 g    Refill:  0    Order Specific Question:   Supervising Provider    Answer:   Unk Pinto [[id number]]   sulfamethoxazole-trimethoprim (BACTRIM DS) 800-160 MG tablet    Sig: Take 1 tablet by mouth 2 (two) times daily for 5 days.    Dispense:  10 tablet    Refill:  0    Order Specific Question:   Supervising Provider    Answer:   Unk Pinto [phone]    Future Appointments  Date Time Provider [place]  03/02/2023  9:00 AM  Darrol Jump, NP [facility] None    HPI 62 y.o. Male Patient, presents for general follow up. He has IBS (irritable bowel syndrome) - mild w diarrhea; Hypertension; Environmental allergies; DDD (degenerative disc disease), lumbar; ED (erectile dysfunction); Personal history of colonic polyps; Overweight (BMI 25.0-29.9); B12 deficiency; Vitamin D deficiency; and Cigar smoker on their problem list.   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  He notes an area on his chest that was large and raised but after accidentally hitting it with an outdoor tool, the area busted open and he noticed a foul smell and pustule drainage.  The area has continued to heal but has a small area of pus remaining.  He has not continued to treat.  Denies redness, swelling, pain.  Followed by Dermatology, Dr. Allyson Sabal for rash/skin concerns and routine check ups.    BMI is Body mass index is 27.1 kg/m., he has been working on diet and exercise.   Wt Readings from Last 3 Encounters:  09/01/22 205 lb 6.4 oz (93.2 kg)  02/26/22 201 lb 6.4 oz (91.4 kg)  06/13/21 208 lb (94.3 kg)   He doesn't check BPs at home, does have a cuff. Today their BP is BP: 134/86 He does workout. He denies chest pain, shortness of breath, dizziness.    He is not on cholesterol medication and denies myalgias. His cholesterol is at goal. The cholesterol last visit was:   Lab Results  Component Value Date   CHOL 150 02/26/2022   HDL 50 02/26/2022   LDLCALC 84  02/26/2022   TRIG 75 02/26/2022   CHOLHDL 3.0 02/26/2022    Last A1C in the office was:  Lab Results  Component Value Date   HGBA1C 5.1 02/26/2022   Last GFR: Lab Results  Component Value Date   GFRNONAA 102 08/28/2020   Patient is not on Vitamin D supplement, taking 5000 IU daily.  Lab Results  Component Value Date   VD25OH 67 02/26/2022     Last PSA was: Lab Results  Component Value Date   PSA 0.65  02/26/2022   Last testosterone check: Lab Results  Component Value Date   TESTOSTERONE 262 07/27/2019      Current Medications:  Current Outpatient Medications on File Prior to Visit  Medication Sig Dispense Refill   celecoxib (CELEBREX) 100 MG capsule Take 100 mg by mouth daily.     cetirizine (ZYRTEC) 10 MG tablet Take 10 mg by mouth daily.     CHOLECALCIFEROL PO Take 1 tablet by mouth daily. Takes 5000 units daily     Cyanocobalamin (B12 LIQUID HEALTH BOOSTER PO) Take 1 applicator by mouth daily.     fluticasone (CUTIVATE) 0.05 % cream APPLY TOPICALLY TWO TIMES  DAILY AS NEEDED 180 g 3   fluticasone (FLONASE) 50 MCG/ACT nasal spray USE 1 SPRAY IN EACH NOSTRIL EVERY MORNING 16 g 2   ipratropium (ATROVENT) 0.06 % nasal spray Use 1 to 2 sprays in both nostrils 2 to 3 times daily 105 mL 3   losartan (COZAAR) 50 MG tablet TAKE 1 TABLET BY MOUTH  DAILY FOR BLOOD PRESSURE 90 tablet 3   Methylcellulose, Laxative, (CITRUCEL PO) Take by mouth.     sildenafil (VIAGRA) 50 MG tablet Take 50 mg daily as needed by mouth for erectile dysfunction.     traMADol (ULTRAM) 50 MG tablet Take by mouth every 8 (eight) hours.     vitamin C (ASCORBIC ACID) 500 MG tablet Take 500 mg by mouth daily.     Zinc 50 MG CAPS Take by mouth daily.     azithromycin (ZITHROMAX) 250 MG tablet Take 2 tablets with Food on  Day 1, then 1 tablet Daily with Food for Sinusitis / Bronchitis 6 each 1   benzonatate (TESSALON) 200 MG capsule Take 1 perle 3 x / day to prevent cough (Patient not taking: Reported on 09/01/2022) 30 capsule 1   dexamethasone (DECADRON) 4 MG tablet Take 1 tab 3 x day - 3 days, then 2 x day - 3 days, then 1 tab daily 20 tablet 0   No current facility-administered medications on file prior to visit.   Allergies:  Allergies  Allergen Reactions   Hctz [Hydrochlorothiazide]     ED   Morphine And Related Other (See Comments)    " PASSED OUT" PER PT   Health Maintenance:  Immunization History   Administered Date(s) Administered   Influenza Inj Mdck Quad With Preservative 05/20/2017, 04/21/2018, 04/20/2019   Influenza Split 05/10/2014   Influenza, Seasonal, Injecte, Preservative Fre 05/22/2015   Influenza,inj,Quad PF,6+ Mos 06/13/2021   Influenza,inj,quad, With Preservative 05/21/2016   Influenza-Unspecified 05/06/2011, 04/18/2013, 05/06/2020   Moderna Sars-Covid-2 Vaccination 09/06/2019, 10/05/2019, 05/28/2020   PPD Test 05/10/2014   Tdap 07/29/2011    Patient Care Team: Unk Pinto, MD as PCP - General (Internal Medicine) Milus Banister, MD as Consulting Physician (Gastroenterology) Michael Boston, MD as Consulting Physician (General Surgery) Monna Fam, MD as Consulting Physician (Ophthalmology) Kathie Rhodes, MD (Inactive) as Consulting Physician (Urology) Suella Broad, MD as Consulting Physician (Physical  Medicine and Rehabilitation) Druscilla Brownie, MD as Consulting Physician (Dermatology) Mosetta Anis, MD as Referring Physician (Allergy)  Medical History:  has IBS (irritable bowel syndrome) - mild w diarrhea; Hypertension; Environmental allergies; DDD (degenerative disc disease), lumbar; ED (erectile dysfunction); Personal history of colonic polyps; Overweight (BMI 25.0-29.9); B12 deficiency; Vitamin D deficiency; and Cigar smoker on their problem list. 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 Family History:  His family history includes Cancer in his mother; Diabetes in his mother. Social History:   reports that he has been smoking cigars. He has never used smokeless tobacco. He reports current alcohol use of about 14.0 standard drinks of alcohol per week. He reports that he does not use drugs.   Review of Systems:  Review of Systems  Constitutional:  Negative for chills, fever, malaise/fatigue and weight loss.  HENT:  Negative  for congestion, hearing loss, sinus pain, sore throat and tinnitus.   Eyes:  Negative for blurred vision and double vision.  Respiratory:  Negative for cough, sputum production, shortness of breath and wheezing.   Cardiovascular:  Negative for chest pain, palpitations, orthopnea, claudication and leg swelling.  Gastrointestinal:  Negative for abdominal pain, blood in stool, constipation, diarrhea, heartburn, melena, nausea and vomiting.  Genitourinary: Negative.  Negative for dysuria, frequency, hematuria and urgency.  Musculoskeletal:  Negative for back pain, joint pain and myalgias.  Skin:  Negative for rash.       Nodule on chest  Neurological:  Negative for dizziness, tingling, sensory change, weakness and headaches.  Endo/Heme/Allergies:  Positive for environmental allergies. Negative for polydipsia.  Psychiatric/Behavioral: Negative.  Negative for depression and substance abuse. The patient is not nervous/anxious and does not have insomnia.   All other systems reviewed and are negative.   Physical Exam: Estimated body mass index is 27.1 kg/m as calculated from the following:   Height as of this encounter: '6\' 1"'$  (1.854 m).   Weight as of this encounter: 205 lb 6.4 oz (93.2 kg). BP 134/86   Pulse 71   Temp 97.7 F (36.5 C)   Ht '6\' 1"'$  (1.854 m)   Wt 205 lb 6.4 oz (93.2 kg)   SpO2 98%   BMI 27.10 kg/m   General Appearance: Well nourished, in no apparent distress.  Eyes: PERRLA, EOMs, conjunctiva no swelling or erythema, normal fundi and vessels.  Sinuses: No Frontal/maxillary tenderness  ENT/Mouth: Ext aud canals clear, normal light reflex with TMs without erythema, bulging. Good dentition. No erythema, swelling, or exudate on post pharynx. Tonsils not swollen or erythematous. Hearing normal.  Neck: Supple, thyroid normal. No bruits  Respiratory: Respiratory effort normal, BS equal bilaterally without rales, rhonchi, wheezing or stridor.  Cardio: RRR without murmurs, rubs or  gallops. Brisk peripheral pulses without edema.  Chest: symmetric, with normal excursions and percussion.  Abdomen: Soft, nontender, no guarding, rebound, hernias, masses, or organomegaly.  Lymphatics: Non tender without lymphadenopathy.  Genitourinary: Defer, no issues Musculoskeletal: Full ROM all peripheral extremities, 5/5 strength, and normal gait.  Skin: Center or chest with small round slightly raised firm nodule with small pustule.  Surrounding skin WNL.  Warm, dry without rashes, lesions, ecchymosis. Neuro: Cranial nerves intact, reflexes equal bilaterally. Normal muscle tone, no cerebellar symptoms. Sensation intact.  Psych: Awake and oriented X 3, normal affect, Insight and Judgment appropriate.    Giovan Pinsky 12:55 PM [facility]

## 2022-09-02 LAB — COMPLETE METABOLIC PANEL WITH GFR
AG Ratio: 2 (calc) (ref 1.0–2.5)
ALT: 16 U/L (ref 9–46)
AST: 16 U/L (ref 10–35)
Albumin: 4.7 g/dL (ref 3.6–5.1)
Alkaline phosphatase (APISO): 58 U/L (ref 35–144)
BUN: 13 mg/dL (ref 7–25)
CO2: 30 mmol/L (ref 20–32)
Calcium: 9.8 mg/dL (ref 8.6–10.3)
Chloride: 104 mmol/L (ref 98–110)
Creat: 0.73 mg/dL (ref 0.70–1.35)
Globulin: 2.3 g/dL (calc) (ref 1.9–3.7)
Glucose, Bld: 100 mg/dL — ABNORMAL HIGH (ref 65–99)
Potassium: 4.2 mmol/L (ref 3.5–5.3)
Sodium: 141 mmol/L (ref 135–146)
Total Bilirubin: 0.7 mg/dL (ref 0.2–1.2)
Total Protein: 7 g/dL (ref 6.1–8.1)
eGFR: 104 mL/min/{1.73_m2} (ref >=60)

## 2022-10-12 DIAGNOSIS — M19012 Primary osteoarthritis, left shoulder: Secondary | ICD-10-CM | POA: Diagnosis not present

## 2022-10-14 DIAGNOSIS — M25512 Pain in left shoulder: Secondary | ICD-10-CM | POA: Diagnosis not present

## 2022-10-26 DIAGNOSIS — M13841 Other specified arthritis, right hand: Secondary | ICD-10-CM | POA: Diagnosis not present

## 2022-11-04 ENCOUNTER — Telehealth: Payer: Self-pay | Admitting: Nurse Practitioner

## 2022-11-04 NOTE — Telephone Encounter (Signed)
Pt called saying his Ortho dr is wanting copies of blood  work faxed, the patient had no idea what bloodwork and we have not received any correspondence from the doctors office indicating what they need. I asked him to call them and for them to fax over a formal request since they are the ones asking for it, he got upset and said no bc he doesn't feel like that is his job as the patient. I explained to him that there is no way of [place] knowing what specific labs they need bc it varies from pt to pt. He then asked for me to point him to a Dr's office who can "handle" his needs as a patient bc we were making his life difficult. He said he wanted me to take a message and have his Dr Archie Patten) call him to complain about his lack of care, I told him to go on mychart which made him even more upset. To get him off the phone bc he was being belligerent and patronizing, I said I would send a note back. Can you call him please.

## 2022-12-29 DIAGNOSIS — M180 Bilateral primary osteoarthritis of first carpometacarpal joints: Secondary | ICD-10-CM | POA: Diagnosis not present

## 2023-01-20 DIAGNOSIS — M25512 Pain in left shoulder: Secondary | ICD-10-CM | POA: Diagnosis not present

## 2023-01-21 ENCOUNTER — Encounter: Payer: Self-pay | Admitting: Internal Medicine

## 2023-02-11 DIAGNOSIS — M545 Low back pain, unspecified: Secondary | ICD-10-CM | POA: Diagnosis not present

## 2023-02-11 DIAGNOSIS — M19012 Primary osteoarthritis, left shoulder: Secondary | ICD-10-CM | POA: Diagnosis not present

## 2023-02-11 DIAGNOSIS — M47896 Other spondylosis, lumbar region: Secondary | ICD-10-CM | POA: Diagnosis not present

## 2023-03-01 ENCOUNTER — Encounter: Payer: BC Managed Care – PPO | Admitting: Nurse Practitioner

## 2023-03-02 ENCOUNTER — Encounter: Payer: Self-pay | Admitting: Nurse Practitioner

## 2023-03-02 ENCOUNTER — Ambulatory Visit (INDEPENDENT_AMBULATORY_CARE_PROVIDER_SITE_OTHER): Payer: BC Managed Care – PPO | Admitting: Nurse Practitioner

## 2023-03-02 VITALS — BP 140/82 | HR 74 | Temp 97.9°F | Ht 72.0 in | Wt 204.4 lb

## 2023-03-02 DIAGNOSIS — Z0000 Encounter for general adult medical examination without abnormal findings: Secondary | ICD-10-CM

## 2023-03-02 DIAGNOSIS — Z131 Encounter for screening for diabetes mellitus: Secondary | ICD-10-CM | POA: Diagnosis not present

## 2023-03-02 DIAGNOSIS — M51369 Other intervertebral disc degeneration, lumbar region without mention of lumbar back pain or lower extremity pain: Secondary | ICD-10-CM

## 2023-03-02 DIAGNOSIS — Z1389 Encounter for screening for other disorder: Secondary | ICD-10-CM | POA: Diagnosis not present

## 2023-03-02 DIAGNOSIS — Z1329 Encounter for screening for other suspected endocrine disorder: Secondary | ICD-10-CM

## 2023-03-02 DIAGNOSIS — Z13220 Encounter for screening for lipoid disorders: Secondary | ICD-10-CM

## 2023-03-02 DIAGNOSIS — Z125 Encounter for screening for malignant neoplasm of prostate: Secondary | ICD-10-CM | POA: Diagnosis not present

## 2023-03-02 DIAGNOSIS — Z79899 Other long term (current) drug therapy: Secondary | ICD-10-CM

## 2023-03-02 DIAGNOSIS — M1929 Secondary osteoarthritis, other specified site: Secondary | ICD-10-CM

## 2023-03-02 DIAGNOSIS — E559 Vitamin D deficiency, unspecified: Secondary | ICD-10-CM | POA: Diagnosis not present

## 2023-03-02 DIAGNOSIS — Z136 Encounter for screening for cardiovascular disorders: Secondary | ICD-10-CM | POA: Diagnosis not present

## 2023-03-02 DIAGNOSIS — R350 Frequency of micturition: Secondary | ICD-10-CM | POA: Diagnosis not present

## 2023-03-02 DIAGNOSIS — I10 Essential (primary) hypertension: Secondary | ICD-10-CM | POA: Diagnosis not present

## 2023-03-02 DIAGNOSIS — N401 Enlarged prostate with lower urinary tract symptoms: Secondary | ICD-10-CM

## 2023-03-02 DIAGNOSIS — M5136 Other intervertebral disc degeneration, lumbar region: Secondary | ICD-10-CM

## 2023-03-02 DIAGNOSIS — N529 Male erectile dysfunction, unspecified: Secondary | ICD-10-CM

## 2023-03-02 DIAGNOSIS — Z860100 Personal history of colon polyps, unspecified: Secondary | ICD-10-CM

## 2023-03-02 DIAGNOSIS — K580 Irritable bowel syndrome with diarrhea: Secondary | ICD-10-CM

## 2023-03-02 DIAGNOSIS — Z0001 Encounter for general adult medical examination with abnormal findings: Secondary | ICD-10-CM

## 2023-03-02 DIAGNOSIS — M79641 Pain in right hand: Secondary | ICD-10-CM

## 2023-03-02 DIAGNOSIS — Z9109 Other allergy status, other than to drugs and biological substances: Secondary | ICD-10-CM

## 2023-03-02 DIAGNOSIS — M79605 Pain in left leg: Secondary | ICD-10-CM

## 2023-03-02 NOTE — Patient Instructions (Signed)

## 2023-03-02 NOTE — Progress Notes (Signed)
Complete Physical  Assessment and Plan:  Encounter for general adult medical examination with abnormal findings Due annually  Health maintenance reviewed Healthily lifestyle goals set  Essential hypertension Above [place] today-asymptomatic. Continue medications as directed. Monitor BP in home and record for further review and evaluation. Call if consistently greater than 130/80.  Discussed DASH (Dietary Approaches to Stop Hypertension) DASH diet is lower in sodium than a typical American diet. Cut back on foods that are high in saturated fat, cholesterol, and trans fats. Eat more whole-grain foods, fish, poultry, and nuts Remain active and exercise as tolerated daily.  Check CMP/CBC   Irritable bowel syndrome with diarrhea Currently controlled. Avoid triggers, find ways to reduce stress.  Personal history of colonic polyps Had in 2019, due 5 year follow up  DDD (degenerative disc disease), lumbar No recent flare, currently well managed.  Erectile dysfunction, unspecified erectile dysfunction type Sildenafil 50 mg working well  Environmental allergies Managed with current medications. Avoid triggers.  Screening for thyroid disorder -     TSH  Screening for hyperlipidemia -     Lipid panel   Screening for diabetes mellitus -     Hemoglobin A1c  Screening for cardiovascular condition -     EKG 12-Lead  Screening for prostate cancer -     PSA  Screening for hematuria or proteinuria -     Urinalysis, Complete   Medication management -     CBC with Differential/Platelet -     CMP/GFR  Vitamin D deficiency -     VITAMIN D 25 Hydroxy (Vit-D Deficiency, Fractures)  Screening for hematuria or proteinuria  -UA  Screening for cholesterol   -Lipid panel  Screening for DM  -A1c/Insulin  Right hand pain/Left leg pain/Arthritis Continue to follow with Orthopedics Managed well.  Orders Placed This Encounter  Procedures   CBC with Differential/Platelet    COMPLETE METABOLIC PANEL WITH GFR   Lipid panel   TSH   Hemoglobin A1c   Insulin, random   VITAMIN D 25 Hydroxy (Vit-D Deficiency, Fractures)   Urinalysis, Routine w reflex microscopic   Microalbumin / creatinine urine ratio   PSA   EKG 12-Lead    Notify office for further evaluation and treatment, questions or concerns if any reported s/s fail to improve.   The patient was advised to call back or seek an in-person evaluation if any symptoms worsen or if the condition fails to improve as anticipated.   Further disposition pending results of labs. Discussed med's effects and SE's.    I discussed the assessment and treatment plan with the patient. The patient was provided an opportunity to ask questions and all were answered. The patient agreed with the plan and demonstrated an understanding of the instructions.  Discussed med's effects and SE's. Screening labs and tests as requested with regular follow-up as recommended.  I provided 35 minutes of face-to-face time during this encounter including counseling, chart review, and critical decision making was preformed.  Today's Plan of Care is based on a patient-centered health care approach known as shared decision making - the decisions, tests and treatments allow for patient preferences and values to be balanced with clinical evidence.    Future Appointments  Date Time Provider Department Center  03/01/2024  9:00 AM Adela Glimpse, NP [facility] None    HPI 62 y.o. Male Patient, presents for a complete physical. He has IBS (irritable bowel syndrome) - mild w diarrhea; Hypertension; Environmental allergies; DDD (degenerative disc disease), lumbar; ED (erectile dysfunction);  Personal history of colonic polyps; Overweight (BMI 25.0-29.9); B12 deficiency; Vitamin D deficiency; and Cigar smoker on their problem list.   Overall he reports feeling well today.  He has no issues or concerns at this time.  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    He is following Orthopedics, Dr. Frazier Butt for pain in right hand/arthritis and OA of left leg.  Also following Dr Ethelene Hal.  Currently well controlled.  Followed by Dr. Terri Piedra (derm) - for rash/skin concerns and routine check ups.   BMI is Body mass index is 27.72 kg/m., he has been working on diet and exercise.   Wt Readings from Last 3 Encounters:  03/02/23 204 lb 6.4 oz (92.7 kg)  09/01/22 205 lb 6.4 oz (93.2 kg)  02/26/22 201 lb 6.4 oz (91.4 kg)   He doesn't check BPs at home, does have a cuff. Today their BP is BP: (!) 140/82 He does workout. He denies chest pain, shortness of breath, dizziness.    He is not on cholesterol medication and denies myalgias. His cholesterol is at goal. The cholesterol last visit was:   Lab Results  Component Value Date   CHOL 150 02/26/2022   HDL 50 02/26/2022   LDLCALC 84 02/26/2022   TRIG 75 02/26/2022   CHOLHDL 3.0 02/26/2022    Last A1C in the office was:  Lab Results  Component Value Date   HGBA1C 5.1 02/26/2022   Last GFR: Lab Results  Component Value Date   GFRNONAA 102 08/28/2020   Patient is not on Vitamin D supplement, taking 5000 IU daily.  Lab Results  Component Value Date   VD25OH 58 02/26/2022     Last PSA was: Lab Results  Component Value Date   PSA 0.65 02/26/2022   Last testosterone check: Lab Results  Component Value Date   TESTOSTERONE 262 07/27/2019      Current Medications:  Current Outpatient Medications on File Prior to Visit  Medication Sig Dispense Refill   celecoxib (CELEBREX) 100 MG capsule Take 100 mg by mouth daily.     cetirizine (ZYRTEC) 10 MG tablet Take 10 mg by mouth daily.     CHOLECALCIFEROL PO Take 1 tablet by mouth daily. Takes 5000 units daily     Cyanocobalamin (B12 LIQUID HEALTH BOOSTER PO) Take 1 applicator by mouth daily.     fluticasone (CUTIVATE) 0.05 % cream APPLY TOPICALLY TWO TIMES  DAILY AS  NEEDED 180 g 3   fluticasone (FLONASE) 50 MCG/ACT nasal spray USE 1 SPRAY IN EACH NOSTRIL EVERY MORNING 16 g 2   ipratropium (ATROVENT) 0.06 % nasal spray Use 1 to 2 sprays in both nostrils 2 to 3 times daily 105 mL 3   losartan (COZAAR) 50 MG tablet TAKE 1 TABLET BY MOUTH  DAILY FOR BLOOD PRESSURE 90 tablet 3   Methylcellulose, Laxative, (CITRUCEL PO) Take by mouth.     sildenafil (VIAGRA) 50 MG tablet Take 50 mg daily as needed by mouth for erectile dysfunction.     traMADol (ULTRAM) 50 MG tablet Take by mouth every 8 (eight) hours.     vitamin C (ASCORBIC ACID) 500 MG tablet Take 500 mg by mouth daily.     Zinc 50 MG CAPS Take by mouth daily.     benzonatate (TESSALON) 200 MG capsule Take 1 perle 3 x / day to prevent cough 30 capsule 1   mupirocin cream (BACTROBAN) 2 % Apply 1 Application topically  2 (two) times daily. (Patient not taking: Reported on 03/02/2023) 15 g 0   No current facility-administered medications on file prior to visit.   Allergies:  Allergies  Allergen Reactions   Hctz [Hydrochlorothiazide]     ED   Morphine And Codeine Other (See Comments)    " PASSED OUT" PER PT   Health Maintenance:  Immunization History  Administered Date(s) Administered   Influenza Inj Mdck Quad With Preservative 05/20/2017, 04/21/2018, 04/20/2019   Influenza Split 05/10/2014   Influenza, Seasonal, Injecte, Preservative Fre 05/22/2015   Influenza,inj,Quad PF,6+ Mos 06/13/2021   Influenza,inj,quad, With Preservative 05/21/2016   Influenza-Unspecified 05/06/2011, 04/18/2013, 05/06/2020   Moderna Sars-Covid-2 Vaccination 09/06/2019, 10/05/2019, 05/28/2020   PPD Test 05/10/2014   Tdap 07/29/2011    Tetanus: Due  Flu vaccine: Due 04/2022 Shingrix: declines Covid 19: has gotten 3/3, 2021, pfizer  Colonoscopy: 2019 - polyp biopsied - due 5 years EGD: n/a   Eye Exam: has been years, encouraged to schedule  Dentist: Dr. Alvester Morin - q6 months - no issues Derm: Dr. Nicholas Lose - yearly  Patient  Care Team: Lucky Cowboy, MD as PCP - General (Internal Medicine) Rachael Fee, MD as Consulting Physician (Gastroenterology) Karie Soda, MD as Consulting Physician (General Surgery) Mateo Flow, MD as Consulting Physician (Ophthalmology) Ihor Gully, MD (Inactive) as Consulting Physician (Urology) Sheran Luz, MD as Consulting Physician (Physical Medicine and Rehabilitation) Cherlyn Roberts, MD as Consulting Physician (Dermatology) Sidney Ace, MD as Referring Physician (Allergy)  Medical History:  has IBS (irritable bowel syndrome) - mild w diarrhea; Hypertension; Environmental allergies; DDD (degenerative disc disease), lumbar; ED (erectile dysfunction); Personal history of colonic polyps; Overweight (BMI 25.0-29.9); B12 deficiency; Vitamin D deficiency; and Cigar smoker on their problem list. 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 Family History:  His family history includes Cancer in his mother; Diabetes in his mother. Social History:   reports that he has been smoking cigars. He has never used smokeless tobacco. He reports current alcohol use of about 14.0 standard drinks of alcohol per week. He reports that he does not use drugs.   Review of Systems:  Review of Systems  Constitutional:  Negative for chills, fever, malaise/fatigue and weight loss.  HENT:  Negative for congestion, hearing loss, sinus pain, sore throat and tinnitus.   Eyes:  Negative for blurred vision and double vision.  Respiratory:  Negative for cough, sputum production, shortness of breath and wheezing.   Cardiovascular:  Negative for chest pain, palpitations, orthopnea, claudication and leg swelling.  Gastrointestinal:  Negative for abdominal pain, blood in stool, constipation, diarrhea, heartburn, melena, nausea and vomiting.  Genitourinary: Negative.  Negative for  dysuria, frequency, hematuria and urgency.  Musculoskeletal:  Positive for joint pain. Negative for back pain and myalgias.  Skin:  Negative for rash.  Neurological:  Negative for dizziness, tingling, sensory change, weakness and headaches.  Endo/Heme/Allergies:  Positive for environmental allergies. Negative for polydipsia.  Psychiatric/Behavioral: Negative.  Negative for depression and substance abuse. The patient is not nervous/anxious and does not have insomnia.   All other systems reviewed and are negative.   Physical Exam: Estimated body mass index is 27.72 kg/m as calculated from the following:   Height as of this encounter: 6' (1.829 m).   Weight as of this encounter: 204 lb 6.4 oz (92.7 kg). BP (!) 140/82   Pulse 74   Temp 97.9 F (36.6  C)   Ht 6' (1.829 m)   Wt 204 lb 6.4 oz (92.7 kg)   SpO2 98%   BMI 27.72 kg/m  General Appearance: Well nourished, in no apparent distress.  Eyes: PERRLA, EOMs, conjunctiva no swelling or erythema, normal fundi and vessels.  Sinuses: No Frontal/maxillary tenderness  ENT/Mouth: Ext aud canals clear, normal light reflex with TMs without erythema, bulging. Good dentition. No erythema, swelling, or exudate on post pharynx. Tonsils not swollen or erythematous. Hearing normal.  Neck: Supple, thyroid normal. No bruits  Respiratory: Respiratory effort normal, BS equal bilaterally without rales, rhonchi, wheezing or stridor.  Cardio: RRR without murmurs, rubs or gallops. Brisk peripheral pulses without edema.  Chest: symmetric, with normal excursions and percussion.  Abdomen: Soft, nontender, no guarding, rebound, hernias, masses, or organomegaly.  Lymphatics: Non tender without lymphadenopathy.  Genitourinary: Defer, no issues Musculoskeletal: Full ROM all peripheral extremities, 5/5 strength, and normal gait.  Skin: Warm, dry without rashes, lesions, ecchymosis. Neuro: Cranial nerves intact, reflexes equal bilaterally. Normal muscle tone, no  cerebellar symptoms. Sensation intact.  Psych: Awake and oriented X 3, normal affect, Insight and Judgment appropriate.   EKG: NSR  Mitsy Owen 9:46 AM [facility]

## 2023-03-03 LAB — CBC WITH DIFFERENTIAL/PLATELET
Absolute Monocytes: 624 cells/uL (ref 200–950)
Basophils Absolute: 38 {cells}/uL (ref 0–200)
Basophils Relative: 0.6 %
Eosinophils Absolute: 132 {cells}/uL (ref 15–500)
Eosinophils Relative: 2.1 %
HCT: 42.4 % (ref 38.5–50.0)
Hemoglobin: 14.6 g/dL (ref 13.2–17.1)
Lymphs Abs: 1821 {cells}/uL (ref 850–3900)
MCH: 32.4 pg (ref 27.0–33.0)
MCHC: 34.4 g/dL (ref 32.0–36.0)
MCV: 94 fL (ref 80.0–100.0)
MPV: 9.9 fL (ref 7.5–12.5)
Monocytes Relative: 9.9 %
Neutro Abs: 3686 {cells}/uL (ref 1500–7800)
Neutrophils Relative %: 58.5 %
Platelets: 240 10*3/uL (ref 140–400)
RBC: 4.51 10*6/uL (ref 4.20–5.80)
RDW: 11.6 % (ref 11.0–15.0)
Total Lymphocyte: 28.9 %
WBC: 6.3 10*3/uL (ref 3.8–10.8)

## 2023-03-03 LAB — COMPLETE METABOLIC PANEL WITH GFR
AG Ratio: 1.9 (calc) (ref 1.0–2.5)
ALT: 19 U/L (ref 9–46)
AST: 21 U/L (ref 10–35)
Albumin: 4.4 g/dL (ref 3.6–5.1)
Alkaline phosphatase (APISO): 57 U/L (ref 35–144)
BUN: 11 mg/dL (ref 7–25)
CO2: 27 mmol/L (ref 20–32)
Calcium: 9.6 mg/dL (ref 8.6–10.3)
Chloride: 103 mmol/L (ref 98–110)
Creat: 0.8 mg/dL (ref 0.70–1.35)
Globulin: 2.3 g/dL (ref 1.9–3.7)
Glucose, Bld: 97 mg/dL (ref 65–99)
Potassium: 4.2 mmol/L (ref 3.5–5.3)
Sodium: 139 mmol/L (ref 135–146)
Total Bilirubin: 0.8 mg/dL (ref 0.2–1.2)
Total Protein: 6.7 g/dL (ref 6.1–8.1)
eGFR: 101 mL/min/{1.73_m2} (ref >=60)

## 2023-03-03 LAB — URINALYSIS, ROUTINE W REFLEX MICROSCOPIC
Bilirubin Urine: NEGATIVE
Glucose, UA: NEGATIVE
Hgb urine dipstick: NEGATIVE
Ketones, ur: NEGATIVE
Leukocytes,Ua: NEGATIVE
Nitrite: NEGATIVE
Protein, ur: NEGATIVE
Specific Gravity, Urine: 1.005 (ref 1.001–1.035)
pH: 7.5 (ref 5.0–8.0)

## 2023-03-03 LAB — MICROALBUMIN / CREATININE URINE RATIO
Creatinine, Urine: 25 mg/dL (ref 20–320)
Microalb, Ur: <0.2 mg/dL

## 2023-03-03 LAB — LIPID PANEL
Cholesterol: 154 mg/dL (ref <200)
HDL: 56 mg/dL (ref >=40)
LDL Cholesterol (Calc): 82 mg/dL
Non-HDL Cholesterol (Calc): 98 mg/dL (ref <130)
Total CHOL/HDL Ratio: 2.8 (calc) (ref <5.0)
Triglycerides: 81 mg/dL (ref <150)

## 2023-03-03 LAB — INSULIN, RANDOM: Insulin: 9.6 u[IU]/mL

## 2023-03-03 LAB — HEMOGLOBIN A1C
Hgb A1c MFr Bld: 5.4 %{Hb} (ref <5.7)
Mean Plasma Glucose: 108 mg/dL
eAG (mmol/L): 6 mmol/L

## 2023-03-03 LAB — VITAMIN D 25 HYDROXY (VIT D DEFICIENCY, FRACTURES): Vit D, 25-Hydroxy: 50 ng/mL (ref 30–100)

## 2023-03-03 LAB — TSH: TSH: 2.64 m[IU]/L (ref 0.40–4.50)

## 2023-03-03 LAB — PSA: PSA: 0.6 ng/mL (ref <=4.00)

## 2023-03-19 ENCOUNTER — Ambulatory Visit: Payer: BC Managed Care – PPO | Admitting: Nurse Practitioner

## 2023-03-19 ENCOUNTER — Encounter: Payer: Self-pay | Admitting: Nurse Practitioner

## 2023-03-19 VITALS — BP 118/82 | HR 75 | Temp 97.0°F | Ht 72.0 in | Wt 203.0 lb

## 2023-03-19 DIAGNOSIS — H6122 Impacted cerumen, left ear: Secondary | ICD-10-CM | POA: Diagnosis not present

## 2023-03-19 DIAGNOSIS — H9203 Otalgia, bilateral: Secondary | ICD-10-CM

## 2023-03-19 DIAGNOSIS — H9193 Unspecified hearing loss, bilateral: Secondary | ICD-10-CM

## 2023-03-19 DIAGNOSIS — H6121 Impacted cerumen, right ear: Secondary | ICD-10-CM

## 2023-03-19 NOTE — Progress Notes (Signed)
Assessment and Plan:  Walter Black was seen today for an episodic visit.  Diagnoses and all order for this visit:  Left and right ear impacted cerumen Bilateral ear(s) irrigated with lukewarm water, hydrogen peroxide. Large piece of Minkler cerumen x3 extracted with ear curette. TM:  WNL.  Pt tolerated procedure well.    - Ear Lavage  Decreased hearing of both ears/otalgia Do not use Q-tips Suggested OTC Debrox Continue to monitor Refer to ENT if s/s fail to improve  Notify office for further evaluation and treatment, questions or concerns if s/s fail to improve. The risks and benefits of my recommendations, as well as other treatment options were discussed with the patient today. Questions were answered.  Further disposition pending results of labs. Discussed med's effects and SE's.    Over 25 minutes of exam, counseling, chart review, and critical decision making was performed.   Future Appointments  Date Time Provider Department Center  09/02/2023  9:30 AM Adela Glimpse, NP [facility] None  03/01/2024  9:00 AM Tristine Langi, Archie Patten, NP [facility] None    ------------------------------------------------------------------------------------------------------------------   HPI BP 118/82   Pulse 75   Temp (!) 97 F (36.1 C)   Ht 6' (1.829 m)   Wt 203 lb (92.1 kg)   SpO2 98%   BMI 27.53 kg/m     Walter Black is a 62 y.o. male whom I am asked to see for evaluation of diminished hearing and otalgia in both ears for the past 1 week. There is a prior history of cerumen impaction and he normally gets treated at ENT but was unable to be seen. The patient has been using hydrogen peroxide to loosen wax immediately prior to this visit. The patient reports minimal ear pain.   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   cetirizine (ZYRTEC) 10 MG tablet Take 10 mg by mouth daily.   CHOLECALCIFEROL PO Take 1 tablet by mouth daily. Takes 5000 units daily   Cyanocobalamin (B12 LIQUID HEALTH BOOSTER PO) Take 1 applicator by mouth daily.   fluticasone (CUTIVATE) 0.05 % cream APPLY TOPICALLY TWO TIMES  DAILY AS NEEDED   fluticasone (FLONASE) 50 MCG/ACT nasal spray USE 1 SPRAY IN EACH NOSTRIL EVERY MORNING   ipratropium (ATROVENT) 0.06 % nasal spray Use 1 to 2 sprays in both nostrils 2 to 3 times daily   losartan (COZAAR) 50 MG tablet TAKE 1 TABLET BY MOUTH  DAILY FOR BLOOD PRESSURE   Methylcellulose, Laxative, (CITRUCEL PO) Take by mouth.   mupirocin cream (BACTROBAN) 2 % Apply 1 Application topically 2 (two) times daily.   sildenafil (VIAGRA) 50 MG tablet Take 50 mg daily as needed by mouth for erectile dysfunction.   traMADol (ULTRAM) 50 MG tablet Take by mouth every 8 (eight)  hours.   vitamin C (ASCORBIC ACID) 500 MG tablet Take 500 mg by mouth daily.   Zinc 50 MG CAPS Take by mouth daily.   No current facility-administered medications on file prior to visit.    ROS: all negative except what is noted in the HPI.   Physical Exam:  BP 118/82   Pulse 75   Temp (!) 97 F (36.1 C)   Ht 6' (1.829 m)   Wt 203 lb (92.1 kg)   SpO2 98%   BMI 27.53 kg/m   General Appearance: NAD.  Awake, conversant and cooperative. Eyes: PERRLA, EOMs intact.  Sclera white.  Conjunctiva without erythema. Sinuses: No frontal/maxillary tenderness.  No nasal discharge. Nares patent.  ENT/Mouth: Ext aud canals clear.   After successful irrigation  Bilateral TMs w/DOL and without erythema or bulging. Hearing intact.  Posterior pharynx without swelling or exudate.  Tonsils without swelling or erythema.  Neck: Supple.  No masses, nodules or thyromegaly. Respiratory: Effort is regular with non-labored breathing. Breath sounds are equal bilaterally without rales, rhonchi, wheezing or stridor.  Cardio: RRR with no MRGs. Brisk peripheral pulses without edema.  Abdomen: Active BS in all four quadrants.  Soft and non-tender without guarding, rebound tenderness, hernias or masses. Lymphatics: Non tender without lymphadenopathy.  Musculoskeletal: Full ROM, 5/5 strength, normal ambulation.  No clubbing or cyanosis. Skin: Appropriate color for ethnicity. Warm without rashes, lesions, ecchymosis, ulcers.  Neuro: CN II-XII grossly normal. Normal muscle tone without cerebellar symptoms and intact sensation.   Psych: AO X 3,  appropriate mood and affect, insight and judgment.     Adela Glimpse, NP 12:24 PM [facility]

## 2023-03-23 DIAGNOSIS — H608X3 Other otitis externa, bilateral: Secondary | ICD-10-CM | POA: Diagnosis not present

## 2023-03-28 NOTE — Patient Instructions (Signed)
Ear Irrigation Ear irrigation is a procedure to wash dirt and wax out of your ear canal. It's also called lavage. You may need this if you're having trouble hearing because of wax in your ear. You may also have it done as part of the treatment for an ear infection.  Getting wax and dirt out of your ear can help ear drops work better. Tell a health care provider about: Any allergies you have. All medicines you're taking, including vitamins, herbs, eye drops, creams, and over-the-counter medicines. Any problems you or family members have had with anesthesia. Any bleeding problems you have. Any surgeries you've had. This includes any ear surgeries. Any medical conditions you have, such as any problems with your ear. Whether you're pregnant or may be pregnant. What are the risks? Your health care provider will talk with you about risks. These may include: Infection. Pain. Loss of hearing. Fluid and debris being pushed into your middle ear. This can happen if there are holes in your eardrum. The procedure not working. Trauma to your ear. Feeling dizzy, light-headed, or nauseous. What happens before the procedure? You'll talk with your provider about the procedure and plan. You may be given ear drops to put in your ear 15-20 minutes before the procedure. This helps loosen the wax. What happens during the procedure?  A syringe will be filled with water or a saline solution. Saline is made of salt and water. The syringe will be gently put into your ear. The fluid will be used to wash out wax and other debris. The procedure may vary among providers and hospitals. What can I expect after the procedure? Follow the instructions given to you by your provider. Follow these instructions at home: Using ear irrigation kits In some cases, you can use an ear irrigation kit at home. Ask your provider if this is an option for you. Use the kit only as told by your provider. Read the instructions on the  package. Follow the directions for using the syringe. Use water that's at room temperature. Do not use an ear irrigation kit if: You have diabetes. This can make you more likely to get an infection. You have a hole or tear in your eardrum. You have tubes in your ears. You've had ear surgery before. You've been told not to irrigate your ears. Cleaning your ears  Clean the outside of your ear with a soft washcloth each day. If told by your provider, use a few drops of baby oil, mineral oil, glycerin, hydrogen peroxide, or earwax softening drops. Do not use cotton swabs to clean your ears. These can push wax down into the ear canal. Do not put things into your ears to try to get rid of wax. This includes ear candles. General instructions Take over-the-counter and prescription medicines only as told by your provider. If you were prescribed antibiotics, use them as told by your provider. Do not stop using the antibiotic even if you start to feel better. Keep your ear clean and dry as told by your provider. See your provider at least once a year to have your ears and hearing checked. Contact a health care provider if: Your hearing isn't getting better. Your hearing is getting worse. You have pain or redness in your ear. You feel dizzy. You have ringing in your ears. You have nausea or vomiting. This information is not intended to replace advice given to you by your health care provider. Make sure you discuss any questions you have with  your health care provider. Document Revised: 09/03/2022 Document Reviewed: 09/03/2022 Elsevier Patient Education  2024 [employer].

## 2023-04-19 DIAGNOSIS — M13841 Other specified arthritis, right hand: Secondary | ICD-10-CM | POA: Diagnosis not present

## 2023-04-19 DIAGNOSIS — M13842 Other specified arthritis, left hand: Secondary | ICD-10-CM | POA: Diagnosis not present

## 2023-04-21 DIAGNOSIS — M25512 Pain in left shoulder: Secondary | ICD-10-CM | POA: Diagnosis not present

## 2023-06-16 DIAGNOSIS — M19012 Primary osteoarthritis, left shoulder: Secondary | ICD-10-CM | POA: Diagnosis not present

## 2023-06-16 DIAGNOSIS — M47816 Spondylosis without myelopathy or radiculopathy, lumbar region: Secondary | ICD-10-CM | POA: Diagnosis not present

## 2023-06-16 DIAGNOSIS — Z5181 Encounter for therapeutic drug level monitoring: Secondary | ICD-10-CM | POA: Diagnosis not present

## 2023-06-16 DIAGNOSIS — Z79899 Other long term (current) drug therapy: Secondary | ICD-10-CM | POA: Diagnosis not present

## 2023-07-08 ENCOUNTER — Telehealth: Payer: Self-pay | Admitting: Nurse Practitioner

## 2023-07-08 DIAGNOSIS — I10 Essential (primary) hypertension: Secondary | ICD-10-CM

## 2023-07-08 DIAGNOSIS — Z79899 Other long term (current) drug therapy: Secondary | ICD-10-CM

## 2023-07-08 MED ORDER — IPRATROPIUM BROMIDE 0.06 % NA SOLN
NASAL | 3 refills | Status: AC
Start: 2023-07-08 — End: ?

## 2023-07-08 MED ORDER — LOSARTAN POTASSIUM 50 MG PO TABS
ORAL_TABLET | ORAL | 3 refills | Status: AC
Start: 2023-07-08 — End: ?

## 2023-07-08 NOTE — Telephone Encounter (Signed)
 Please refill losartan and ipratropium to [email or web address] - [facility] - [place], [place] - [address]  [address] [place] [address]

## 2023-07-08 NOTE — Addendum Note (Signed)
 Addended by: Dionicio Stall on: 07/08/2023 02:00 PM   Modules accepted: Orders

## 2023-07-23 DIAGNOSIS — M13841 Other specified arthritis, right hand: Secondary | ICD-10-CM | POA: Diagnosis not present

## 2023-07-23 DIAGNOSIS — M13842 Other specified arthritis, left hand: Secondary | ICD-10-CM | POA: Diagnosis not present

## 2023-07-28 DIAGNOSIS — M25512 Pain in left shoulder: Secondary | ICD-10-CM | POA: Diagnosis not present

## 2023-09-02 ENCOUNTER — Ambulatory Visit: Payer: BC Managed Care – PPO | Admitting: Nurse Practitioner

## 2023-09-20 ENCOUNTER — Other Ambulatory Visit: Payer: Self-pay | Admitting: Orthopedic Surgery

## 2023-09-20 DIAGNOSIS — M19012 Primary osteoarthritis, left shoulder: Secondary | ICD-10-CM | POA: Diagnosis not present

## 2023-09-27 ENCOUNTER — Encounter: Payer: Self-pay | Admitting: Orthopedic Surgery

## 2023-09-28 ENCOUNTER — Ambulatory Visit
Admission: RE | Admit: 2023-09-28 | Discharge: 2023-09-28 | Disposition: A | Discharge status: Home / Self Care | Source: Ambulatory Visit | Attending: Orthopedic Surgery | Admitting: Orthopedic Surgery

## 2023-09-28 DIAGNOSIS — M25512 Pain in left shoulder: Secondary | ICD-10-CM | POA: Diagnosis not present

## 2023-09-28 DIAGNOSIS — M19012 Primary osteoarthritis, left shoulder: Secondary | ICD-10-CM | POA: Diagnosis not present

## 2023-09-28 DIAGNOSIS — M25412 Effusion, left shoulder: Secondary | ICD-10-CM | POA: Diagnosis not present

## 2023-09-28 DIAGNOSIS — G8929 Other chronic pain: Secondary | ICD-10-CM | POA: Diagnosis not present

## 2023-10-06 DIAGNOSIS — Z01810 Encounter for preprocedural cardiovascular examination: Secondary | ICD-10-CM | POA: Diagnosis not present

## 2023-10-06 DIAGNOSIS — Z01818 Encounter for other preprocedural examination: Secondary | ICD-10-CM | POA: Diagnosis not present

## 2023-10-06 DIAGNOSIS — I10 Essential (primary) hypertension: Secondary | ICD-10-CM | POA: Diagnosis not present

## 2023-10-06 DIAGNOSIS — Z0000 Encounter for general adult medical examination without abnormal findings: Secondary | ICD-10-CM | POA: Diagnosis not present

## 2023-10-11 DIAGNOSIS — M19012 Primary osteoarthritis, left shoulder: Secondary | ICD-10-CM | POA: Diagnosis not present

## 2023-10-14 DIAGNOSIS — M19012 Primary osteoarthritis, left shoulder: Secondary | ICD-10-CM | POA: Diagnosis not present

## 2023-10-14 DIAGNOSIS — M545 Low back pain, unspecified: Secondary | ICD-10-CM | POA: Diagnosis not present

## 2023-10-14 DIAGNOSIS — M47816 Spondylosis without myelopathy or radiculopathy, lumbar region: Secondary | ICD-10-CM | POA: Diagnosis not present

## 2023-10-26 DIAGNOSIS — M13842 Other specified arthritis, left hand: Secondary | ICD-10-CM | POA: Diagnosis not present

## 2023-10-26 DIAGNOSIS — M13841 Other specified arthritis, right hand: Secondary | ICD-10-CM | POA: Diagnosis not present

## 2023-11-05 DIAGNOSIS — M19012 Primary osteoarthritis, left shoulder: Secondary | ICD-10-CM | POA: Diagnosis not present

## 2023-11-17 DIAGNOSIS — I10 Essential (primary) hypertension: Secondary | ICD-10-CM | POA: Diagnosis not present

## 2023-11-17 DIAGNOSIS — I7090 Unspecified atherosclerosis: Secondary | ICD-10-CM | POA: Diagnosis not present

## 2024-02-22 DIAGNOSIS — M19041 Primary osteoarthritis, right hand: Secondary | ICD-10-CM | POA: Diagnosis not present

## 2024-02-22 DIAGNOSIS — M19042 Primary osteoarthritis, left hand: Secondary | ICD-10-CM | POA: Diagnosis not present

## 2024-03-01 ENCOUNTER — Encounter: Payer: BC Managed Care – PPO | Admitting: Nurse Practitioner

## 2024-05-22 DIAGNOSIS — Z1331 Encounter for screening for depression: Secondary | ICD-10-CM | POA: Diagnosis not present

## 2024-05-22 DIAGNOSIS — Z125 Encounter for screening for malignant neoplasm of prostate: Secondary | ICD-10-CM | POA: Diagnosis not present

## 2024-05-22 DIAGNOSIS — Z23 Encounter for immunization: Secondary | ICD-10-CM | POA: Diagnosis not present

## 2024-05-22 DIAGNOSIS — Z13220 Encounter for screening for lipoid disorders: Secondary | ICD-10-CM | POA: Diagnosis not present

## 2024-05-22 DIAGNOSIS — M47896 Other spondylosis, lumbar region: Secondary | ICD-10-CM | POA: Diagnosis not present

## 2024-05-22 DIAGNOSIS — Z0000 Encounter for general adult medical examination without abnormal findings: Secondary | ICD-10-CM | POA: Diagnosis not present

## 2024-05-22 DIAGNOSIS — M25512 Pain in left shoulder: Secondary | ICD-10-CM | POA: Diagnosis not present

## 2024-05-22 DIAGNOSIS — I10 Essential (primary) hypertension: Secondary | ICD-10-CM | POA: Diagnosis not present
